# Patient Record
Sex: Male | Born: 1970
Health system: Southern US, Community
[De-identification: ages and names within clinical notes are randomized; demographics above are authoritative.]

## PROBLEM LIST (undated history)

## (undated) DIAGNOSIS — C801 Malignant (primary) neoplasm, unspecified: Secondary | ICD-10-CM

## (undated) DIAGNOSIS — J45909 Unspecified asthma, uncomplicated: Secondary | ICD-10-CM

## (undated) DIAGNOSIS — G4730 Sleep apnea, unspecified: Secondary | ICD-10-CM

## (undated) HISTORY — PX: BICEPS TENDON REPAIR: SHX566

## (undated) HISTORY — DX: Unspecified asthma, uncomplicated: J45.909

## (undated) HISTORY — PX: FRACTURE SURGERY: SHX138

---

## 2005-09-25 ENCOUNTER — Emergency Department (HOSPITAL_COMMUNITY): Admission: EM | Admit: 2005-09-25 | Discharge: 2005-09-25 | Payer: Self-pay | Admitting: Emergency Medicine

## 2005-10-02 ENCOUNTER — Ambulatory Visit (HOSPITAL_BASED_OUTPATIENT_CLINIC_OR_DEPARTMENT_OTHER): Admission: RE | Admit: 2005-10-02 | Discharge: 2005-10-02 | Payer: Self-pay | Admitting: *Deleted

## 2008-07-06 ENCOUNTER — Emergency Department (HOSPITAL_COMMUNITY): Admission: EM | Admit: 2008-07-06 | Discharge: 2008-07-06 | Payer: Self-pay | Admitting: Emergency Medicine

## 2008-07-16 ENCOUNTER — Encounter: Payer: Self-pay | Admitting: Cardiovascular Disease

## 2008-07-16 ENCOUNTER — Ambulatory Visit: Payer: Self-pay | Admitting: Cardiovascular Disease

## 2008-07-16 DIAGNOSIS — R0789 Other chest pain: Secondary | ICD-10-CM

## 2008-07-30 ENCOUNTER — Ambulatory Visit: Payer: Self-pay

## 2008-07-30 ENCOUNTER — Encounter: Payer: Self-pay | Admitting: Cardiovascular Disease

## 2008-08-04 ENCOUNTER — Telehealth: Payer: Self-pay | Admitting: Cardiovascular Disease

## 2009-12-23 ENCOUNTER — Emergency Department (HOSPITAL_COMMUNITY): Admission: EM | Admit: 2009-12-23 | Discharge: 2009-12-23 | Payer: Self-pay | Admitting: Emergency Medicine

## 2009-12-24 ENCOUNTER — Ambulatory Visit (HOSPITAL_COMMUNITY): Admission: RE | Admit: 2009-12-24 | Discharge: 2009-12-24 | Payer: Self-pay | Admitting: Family Medicine

## 2010-07-21 LAB — POCT CARDIAC MARKERS
CKMB, poc: 1.4 ng/mL (ref 1.0–8.0)
Myoglobin, poc: 89.9 ng/mL (ref 12–200)
Troponin i, poc: <0.05 ng/mL (ref 0.00–0.09)

## 2010-07-21 LAB — D-DIMER, QUANTITATIVE: D-Dimer, Quant: 0.27 ug/mL-FEU (ref 0.00–0.48)

## 2010-07-21 LAB — POCT I-STAT, CHEM 8
BUN: 16 mg/dL (ref 6–23)
Calcium, Ion: 1.1 mmol/L — ABNORMAL LOW (ref 1.12–1.32)
Chloride: 106 mEq/L (ref 96–112)
Creatinine, Ser: 1.3 mg/dL (ref 0.4–1.5)
Glucose, Bld: 109 mg/dL — ABNORMAL HIGH (ref 70–99)
HCT: 42 % (ref 39.0–52.0)
Hemoglobin: 14.3 g/dL (ref 13.0–17.0)
Potassium: 4.1 mEq/L (ref 3.5–5.1)
Sodium: 137 mEq/L (ref 135–145)
TCO2: 25 mmol/L (ref 0–100)

## 2010-08-26 NOTE — Op Note (Signed)
NAME:  Paul Shepherd, BUSS NO.:  [id number]   MEDICAL RECORD NO.:  [id number]          PATIENT TYPE:  AMB   LOCATION:  DSC                          FACILITY:  [facility]   PHYSICIAN:  Tennis Must Meyerdierks, M.D.DATE OF BIRTH:  12-01-1970   DATE OF PROCEDURE:  10/02/2005  DATE OF DISCHARGE:                                 OPERATIVE REPORT   PREOPERATIVE DIAGNOSIS:  Lacerated ulnar digital nerve, right small finger.   POSTOPERATIVE DIAGNOSIS:  Partial laceration ulnar digital nerve, right  small finger.   PROCEDURE:  Repair of ulnar digital nerve, right small finger.   SURGEON:  Lowell Bouton, M.D.   ANESTHESIA:  General.   OPERATIVE FINDINGS:  The patient had an 80% transection ulnarly on the ulnar  digital nerve.   PROCEDURE NOTE:  Under general anesthesia with a tourniquet on the right  arm, the right hand was prepped and draped in the usual fashion and after  exsanguinating the limb, the tourniquet was inflated to 250 mmHg.  The  previous sutures were removed and the laceration was extended proximally.  Blunt dissection was carried through the subcutaneous tissues.  Blunt  dissection was carried down to the ulnar digital nerve which was identified  and was found to be 20% intact.  The radial 20% was still approximated, the  ulnar 80% was transected.  The microscope was brought into the field and  micro instruments were used. A 9-0 nylon suture was used to repair the nerve  using an epineurial repair.  Three sutures were inserted to repair the 80%  of the transected nerve.  The wound was then irrigated copiously with  saline, the skin was closed with 4-0 nylon sutures.  Sterile dressings were  applied, 0.5% Marcaine digital block was inserted for pain control.  The  tourniquet was released with good circulation to the hand.  The patient went  to the recovery room awake in stable and good condition.      Lowell Bouton, M.D.  Electronically Signed     EMM/MEDQ  D:  10/02/2005  T:  10/02/2005  Job:  [id number]

## 2014-06-02 ENCOUNTER — Ambulatory Visit: Payer: Self-pay | Admitting: Family Medicine

## 2014-10-15 ENCOUNTER — Encounter: Payer: Self-pay | Admitting: *Deleted

## 2014-10-15 ENCOUNTER — Other Ambulatory Visit: Payer: Self-pay

## 2014-10-15 NOTE — Patient Instructions (Signed)
  Your procedure is scheduled on: 10-22-14 Report to [facility] To find out your arrival time please call [phone] between 1PM - 3PM on 10-21-14  Remember: Instructions that are not followed completely may result in serious medical risk, up to and including death, or upon the discretion of your surgeon and anesthesiologist your surgery may need to be rescheduled.    _X___ 1. Do not eat food or drink liquids after midnight. No gum chewing or hard candies.     _X___ 2. No Alcohol for 24 hours before or after surgery.   ____ 3. Bring all medications with you on the day of surgery if instructed.    ____ 4. Notify your doctor if there is any change in your medical condition     (cold, fever, infections).     Do not wear jewelry, make-up, hairpins, clips or nail polish.  Do not wear lotions, powders, or perfumes. You may wear deodorant.  Do not shave 48 hours prior to surgery. Men may shave face and neck.  Do not bring valuables to the hospital.    [facility] is not responsible for any belongings or valuables.               Contacts, dentures or bridgework may not be worn into surgery.  Leave your suitcase in the car. After surgery it may be brought to your room.  For patients admitted to the hospital, discharge time is determined by your treatment team.   Patients discharged the day of surgery will not be allowed to drive home.   Please read over the following fact sheets that you were given:     ____ Take these medicines the morning of surgery with A SIP OF WATER:    1. NONE  2.   3.   4.  5.  6.  ____ Fleet Enema (as directed)   ____ Use CHG Soap as directed  ____ Use inhalers on the day of surgery  ____ Stop metformin 2 days prior to surgery    ____ Take 1/2 of usual insulin dose the night before surgery and none on the morning of surgery.   ____ Stop Coumadin/Plavix/aspirin-N/A  ____ Stop Anti-inflammatories-NO NSAIDS OR ASA  PRODUCTS-TYLENOL OK   _X___ Stop supplements until after surgery-PT STOPPED KRILL OIL ON 10-14-14  ____ Bring C-Pap to the hospital.

## 2014-10-22 ENCOUNTER — Encounter
Admission: RE | Disposition: A | Discharge status: Home / Self Care | Payer: Self-pay | Source: Ambulatory Visit | Attending: Otolaryngology

## 2014-10-22 ENCOUNTER — Ambulatory Visit: Payer: Commercial Managed Care - HMO | Admitting: Anesthesiology

## 2014-10-22 ENCOUNTER — Encounter: Payer: Self-pay | Admitting: Anesthesiology

## 2014-10-22 ENCOUNTER — Observation Stay
Admission: RE | Admit: 2014-10-22 | Discharge: 2014-10-22 | Disposition: A | Discharge status: Home / Self Care | Payer: Commercial Managed Care - HMO | Source: Ambulatory Visit | Attending: Otolaryngology | Admitting: Otolaryngology

## 2014-10-22 DIAGNOSIS — R0683 Snoring: Secondary | ICD-10-CM | POA: Diagnosis not present

## 2014-10-22 DIAGNOSIS — Z9889 Other specified postprocedural states: Secondary | ICD-10-CM

## 2014-10-22 DIAGNOSIS — J3501 Chronic tonsillitis: Secondary | ICD-10-CM | POA: Diagnosis present

## 2014-10-22 DIAGNOSIS — G4733 Obstructive sleep apnea (adult) (pediatric): Secondary | ICD-10-CM | POA: Insufficient documentation

## 2014-10-22 DIAGNOSIS — Z6838 Body mass index (BMI) 38.0-38.9, adult: Secondary | ICD-10-CM | POA: Diagnosis not present

## 2014-10-22 DIAGNOSIS — E669 Obesity, unspecified: Secondary | ICD-10-CM | POA: Insufficient documentation

## 2014-10-22 DIAGNOSIS — Z85828 Personal history of other malignant neoplasm of skin: Secondary | ICD-10-CM | POA: Diagnosis not present

## 2014-10-22 HISTORY — PX: UVULOPALATOPHARYNGOPLASTY: SHX827

## 2014-10-22 HISTORY — PX: TONSILLECTOMY: SHX5217

## 2014-10-22 HISTORY — DX: Sleep apnea, unspecified: G47.30

## 2014-10-22 HISTORY — DX: Malignant (primary) neoplasm, unspecified: C80.1

## 2014-10-22 SURGERY — UPPP (UVULOPALATOPHARYNGOPLASTY)
Anesthesia: General | Wound class: Clean Contaminated

## 2014-10-22 MED ORDER — SODIUM CHLORIDE 0.9 % IR SOLN
Status: DC | PRN
  Administered 2014-10-22: 100 mL

## 2014-10-22 MED ORDER — GLYCOPYRROLATE 0.2 MG/ML IJ SOLN
INTRAMUSCULAR | Status: DC | PRN
  Administered 2014-10-22: .5 mg via INTRAVENOUS

## 2014-10-22 MED ORDER — LIDOCAINE VISCOUS 2 % MT SOLN: 10.0000 mL | Freq: Four times a day (QID) | OROMUCOSAL | Status: DC | PRN

## 2014-10-22 MED ORDER — FENTANYL CITRATE (PF) 100 MCG/2ML IJ SOLN
INTRAMUSCULAR | Status: DC | PRN
  Administered 2014-10-22: 250 ug via INTRAVENOUS

## 2014-10-22 MED ORDER — LACTATED RINGERS IV SOLN
INTRAVENOUS | Status: DC | PRN
  Administered 2014-10-22: 10:00:00 via INTRAVENOUS

## 2014-10-22 MED ORDER — DEXTROSE-NACL 5-0.45 % IV SOLN
INTRAVENOUS | Status: DC
  Administered 2014-10-22: 13:00:00 via INTRAVENOUS

## 2014-10-22 MED ORDER — LIDOCAINE HCL (CARDIAC) 20 MG/ML IV SOLN
INTRAVENOUS | Status: DC | PRN
  Administered 2014-10-22: 100 mg via INTRAVENOUS

## 2014-10-22 MED ORDER — BUPIVACAINE HCL (PF) 0.5 % IJ SOLN
INTRAMUSCULAR | Status: AC
  Filled 2014-10-22: qty 30

## 2014-10-22 MED ORDER — ROCURONIUM BROMIDE 100 MG/10ML IV SOLN
INTRAVENOUS | Status: DC | PRN
  Administered 2014-10-22: 35 mg via INTRAVENOUS

## 2014-10-22 MED ORDER — DEXAMETHASONE SODIUM PHOSPHATE 4 MG/ML IJ SOLN
INTRAMUSCULAR | Status: DC | PRN
  Administered 2014-10-22: 10 mg via INTRAVENOUS

## 2014-10-22 MED ORDER — OXYCODONE HCL 5 MG/5ML PO SOLN
10.0000 mg | ORAL | Status: DC | PRN
  Administered 2014-10-22: 10 mg via ORAL
  Filled 2014-10-22: qty 10

## 2014-10-22 MED ORDER — PREDNISONE 10 MG (21) PO TBPK
10.0000 mg | ORAL_TABLET | ORAL | Status: AC
  Administered 2014-10-22: 10 mg via ORAL

## 2014-10-22 MED ORDER — ONDANSETRON HCL 4 MG/2ML IJ SOLN: 4.0000 mg | INTRAMUSCULAR | Status: DC | PRN

## 2014-10-22 MED ORDER — OXYCODONE HCL 5 MG PO TABS: 5.0000 mg | ORAL_TABLET | Freq: Once | ORAL | Status: DC | PRN

## 2014-10-22 MED ORDER — MIDAZOLAM HCL 2 MG/2ML IJ SOLN
INTRAMUSCULAR | Status: DC | PRN
  Administered 2014-10-22: 2 mg via INTRAVENOUS

## 2014-10-22 MED ORDER — PREDNISONE 10 MG (21) PO TBPK: 20.0000 mg | ORAL_TABLET | Freq: Every evening | ORAL | Status: DC

## 2014-10-22 MED ORDER — OXYCODONE HCL 5 MG/5ML PO SOLN: 5.0000 mg | Freq: Once | ORAL | Status: DC | PRN

## 2014-10-22 MED ORDER — MORPHINE SULFATE 2 MG/ML IJ SOLN: 2.0000 mg | INTRAMUSCULAR | Status: DC | PRN

## 2014-10-22 MED ORDER — BUPIVACAINE HCL 0.5 % IJ SOLN
INTRAMUSCULAR | Status: DC | PRN
  Administered 2014-10-22: 1.5 mL

## 2014-10-22 MED ORDER — PREDNISONE 10 MG (21) PO TBPK: 10.0000 mg | ORAL_TABLET | Freq: Four times a day (QID) | ORAL | Status: DC

## 2014-10-22 MED ORDER — HYDROMORPHONE HCL 1 MG/ML IJ SOLN
INTRAMUSCULAR | Status: DC | PRN
  Administered 2014-10-22 (×2): 1 mg via INTRAVENOUS

## 2014-10-22 MED ORDER — PROPOFOL 10 MG/ML IV BOLUS
INTRAVENOUS | Status: DC | PRN
  Administered 2014-10-22: 200 mg via INTRAVENOUS

## 2014-10-22 MED ORDER — PREDNISONE 10 MG (21) PO TBPK
20.0000 mg | ORAL_TABLET | Freq: Every morning | ORAL | Status: DC
  Filled 2014-10-22: qty 21

## 2014-10-22 MED ORDER — OXYCODONE HCL 5 MG/5ML PO SOLN: 10.0000 mg | ORAL | Status: DC | PRN

## 2014-10-22 MED ORDER — PREDNISONE 10 MG (21) PO TBPK
20.0000 mg | ORAL_TABLET | Freq: Every evening | ORAL | Status: DC
Start: 2014-10-23 — End: 2014-10-22

## 2014-10-22 MED ORDER — PREDNISONE 10 MG (21) PO TBPK: ORAL_TABLET | ORAL | Status: DC

## 2014-10-22 MED ORDER — FAMOTIDINE 20 MG PO TABS
20.0000 mg | ORAL_TABLET | Freq: Once | ORAL | Status: AC
  Administered 2014-10-22: 20 mg via ORAL

## 2014-10-22 MED ORDER — PROMETHAZINE HCL 12.5 MG PO TABS: 12.5000 mg | ORAL_TABLET | Freq: Four times a day (QID) | ORAL | Status: DC | PRN

## 2014-10-22 MED ORDER — ACETAMINOPHEN 160 MG/5ML PO SOLN: 650.0000 mg | ORAL | Status: DC | PRN

## 2014-10-22 MED ORDER — SUCCINYLCHOLINE CHLORIDE 20 MG/ML IJ SOLN
INTRAMUSCULAR | Status: DC | PRN
  Administered 2014-10-22: 100 mg via INTRAVENOUS

## 2014-10-22 MED ORDER — ACETAMINOPHEN 10 MG/ML IV SOLN
INTRAVENOUS | Status: DC | PRN
  Administered 2014-10-22: 1000 mg via INTRAVENOUS

## 2014-10-22 MED ORDER — ONDANSETRON HCL 4 MG/2ML IJ SOLN
INTRAMUSCULAR | Status: DC | PRN
  Administered 2014-10-22: 4 mg via INTRAVENOUS

## 2014-10-22 MED ORDER — FENTANYL CITRATE (PF) 100 MCG/2ML IJ SOLN: 25.0000 ug | INTRAMUSCULAR | Status: DC | PRN

## 2014-10-22 MED ORDER — LACTATED RINGERS IV SOLN
Freq: Once | INTRAVENOUS | Status: AC
  Administered 2014-10-22: 10:00:00 via INTRAVENOUS

## 2014-10-22 MED ORDER — PREDNISONE 10 MG (21) PO TBPK
10.0000 mg | ORAL_TABLET | Freq: Three times a day (TID) | ORAL | Status: DC
Start: 2014-10-23 — End: 2014-10-22

## 2014-10-22 MED ORDER — PHENOL 1.4 % MT LIQD: 1.0000 | OROMUCOSAL | Status: DC | PRN

## 2014-10-22 MED ORDER — ONDANSETRON HCL 4 MG PO TABS
4.0000 mg | ORAL_TABLET | ORAL | Status: DC | PRN
Start: 2014-10-22 — End: 2014-10-22

## 2014-10-22 MED ORDER — ACETAMINOPHEN 10 MG/ML IV SOLN
INTRAVENOUS | Status: AC
  Filled 2014-10-22: qty 100

## 2014-10-22 MED ORDER — PREDNISONE 10 MG (21) PO TBPK: 10.0000 mg | ORAL_TABLET | ORAL | Status: DC

## 2014-10-22 MED ORDER — NEOSTIGMINE METHYLSULFATE 10 MG/10ML IV SOLN
INTRAVENOUS | Status: DC | PRN
  Administered 2014-10-22: 3 mg via INTRAVENOUS

## 2014-10-22 MED ORDER — FAMOTIDINE 20 MG PO TABS
ORAL_TABLET | ORAL | Status: AC
  Administered 2014-10-22: 20 mg via ORAL
  Filled 2014-10-22: qty 1

## 2014-10-22 MED ORDER — PHENOL 1.4 % MT LIQD
1.0000 | OROMUCOSAL | Status: DC | PRN
  Filled 2014-10-22: qty 177

## 2014-10-22 MED ORDER — ACETAMINOPHEN 650 MG RE SUPP: 650.0000 mg | RECTAL | Status: DC | PRN

## 2014-10-22 SURGICAL SUPPLY — 21 items
BLADE BOVIE TIP EXT 4 (BLADE) ×4 IMPLANT
BLADE SURG 15 STRL LF DISP TIS (BLADE) ×2 IMPLANT
BLADE SURG 15 STRL SS (BLADE) ×4
CANISTER SUCT 1200ML W/VALVE (MISCELLANEOUS) ×4 IMPLANT
CATH ROBINSON RED A/P 10FR (CATHETERS) ×4 IMPLANT
CATH ROBINSON RED A/P 14FR (CATHETERS) ×4 IMPLANT
COAG SUCT 10F 3.5MM HAND CTRL (MISCELLANEOUS) ×4 IMPLANT
ELECT CAUTERY BLADE TIP 2.5 (TIP) ×4
ELECTRODE CAUTERY BLDE TIP 2.5 (TIP) ×2 IMPLANT
GLOVE BIO SURGEON STRL SZ7.5 (GLOVE) ×6 IMPLANT
GOWN STRL REUS W/ TWL LRG LVL3 (GOWN DISPOSABLE) ×4 IMPLANT
GOWN STRL REUS W/TWL LRG LVL3 (GOWN DISPOSABLE) ×8
HANDLE SUCTION POOLE (INSTRUMENTS) ×2 IMPLANT
KIT RM TURNOVER STRD PROC AR (KITS) ×4 IMPLANT
NS IRRIG 500ML POUR BTL (IV SOLUTION) ×4 IMPLANT
PACK HEAD/NECK (MISCELLANEOUS) ×4 IMPLANT
PAD GROUND ADULT SPLIT (MISCELLANEOUS) ×4 IMPLANT
SUCTION POOLE HANDLE (INSTRUMENTS) ×4
SUT VIC AB 4-0 RB1 18 (SUTURE) ×4 IMPLANT
SYR 3ML LL SCALE MARK (SYRINGE) ×4 IMPLANT
SYR BULB IRRIG 60ML STRL (SYRINGE) ×4 IMPLANT

## 2014-10-22 NOTE — Anesthesia Preprocedure Evaluation (Signed)
Anesthesia Evaluation  Patient identified by MRN, date of birth, ID band Patient awake    Reviewed: Allergy & Precautions, H&P , NPO status , Patient's Chart, lab work & pertinent test results, reviewed documented beta blocker date and time   Airway Mallampati: II  TM Distance: >3 FB Neck ROM: full    Dental no notable dental hx. (+) Teeth Intact   Pulmonary sleep apnea , former smoker,  breath sounds clear to auscultation  Pulmonary exam normal       Cardiovascular - Past MI negative cardio ROS Normal cardiovascular examRhythm:regular Rate:Normal     Neuro/Psych negative neurological ROS  negative psych ROS   GI/Hepatic negative GI ROS, Neg liver ROS,   Endo/Other  negative endocrine ROS  Renal/GU negative Renal ROS  negative genitourinary   Musculoskeletal   Abdominal   Peds  Hematology negative hematology ROS (+)   Anesthesia Other Findings Past Medical History:   Sleep apnea                                                    Comment:no cpap-pt having UPPP on 10-22-14   Cancer                                                         Comment:BASAL CELL CARCINOMA Obesity: BMI 38  Reproductive/Obstetrics negative OB ROS                             Anesthesia Physical Anesthesia Plan  ASA: III  Anesthesia Plan: General ETT   Post-op Pain Management:    Induction:   Airway Management Planned:   Additional Equipment:   Intra-op Plan:   Post-operative Plan:   Informed Consent: I have reviewed the patients History and Physical, chart, labs and discussed the procedure including the risks, benefits and alternatives for the proposed anesthesia with the patient or authorized representative who has indicated his/her understanding and acceptance.   Dental Advisory Given  Plan Discussed with: Anesthesiologist, CRNA and Surgeon  Anesthesia Plan Comments:         Anesthesia Quick  Evaluation

## 2014-10-22 NOTE — Anesthesia Postprocedure Evaluation (Signed)
  Anesthesia Post-op Note  Patient: Paul Shepherd  Procedure(s) Performed: Procedure(s): UVULOPALATOPHARYNGOPLASTY (UPPP) (N/A) TONSILLECTOMY (Bilateral)  Anesthesia type:General ETT  Patient location: PACU  Post pain: Pain level controlled  Post assessment: Post-op Vital signs reviewed, Patient's Cardiovascular Status Stable, Respiratory Function Stable, Patent Airway and No signs of Nausea or vomiting  Post vital signs: Reviewed and stable  Last Vitals:  Filed Vitals:   10/22/14 1205  BP: 133/81  Pulse: 58  Temp: 37 C  Resp:     Level of consciousness: awake, alert  and patient cooperative  Complications: No apparent anesthesia complications

## 2014-10-22 NOTE — H&P (Signed)
..  History and Physical paper copy reviewed and updated date of procedure and will be scanned into system.  

## 2014-10-22 NOTE — Progress Notes (Signed)
Received pt from PACU RN to Rm 230.  AO4 Pt stable, in no acute distress, no c/o pain.  Pt able to swallow.  Pt oxygen saturation 93% on RA--pt on continuous pulse ox and remains above 90%.  Family at bedside.  Phone and Call bell within reach and pt educated use.  Pt resting comfortably and quietly with bed in low and locked position.   Will continue to monitor.

## 2014-10-22 NOTE — Progress Notes (Signed)
..   10/22/2014 5:05 PM  Paul Shepherd [id number]  Post-Op Day 0    Temp:  [98.3 F (36.8 C)-99.8 F (37.7 C)] 98.3 F (36.8 C) (07/14 1549) Pulse Rate:  [49-86] 56 (07/14 1549) Resp:  [10-18] 17 (07/14 1549) BP: (133-153)/(73-91) 138/73 mmHg (07/14 1549) SpO2:  [93 %-100 %] 96 % (07/14 1549) Weight:  [131.543 kg (290 lb)] 131.543 kg (290 lb) (07/14 0919),     Intake/Output Summary (Last 24 hours) at 10/22/14 1705 Last data filed at 10/22/14 1600  Gross per 24 hour  Intake   1720 ml  Output     15 ml  Net   1705 ml    No results found for this or any previous visit (from the past 24 hour(s)).  SUBJECTIVE:  No acute events.  Pain controlled with oral medications.  Able to tolerate fluids.  Ambulating to BR.  OBJECTIVE:  Gen- NAD, alert and oriented eating a popcicle Nose- clear anteriorly OC/OP- UP3 incision intact, mild expected edema and erythema, widely patent airway  IMPRESSION:  S/p UP3 doing well  PLAN:  OK to discharge given tolerance of pain and oral intake.  Follow up on July 25th or July 26th  Paul Shepherd 10/22/2014, 5:05 PM

## 2014-10-22 NOTE — Op Note (Signed)
..10/22/2014  10:59 AM    Paul Shepherd  [id number]   Pre-Op Dx:  OSA, TONSILLAR hypertrophy, chronic tonsillitis  Post-op Dx: OSA, TONSILLAR hypertrophy, chronic tonsillitis  Proc:   1)  Uvulopalatopharyngoplasty  2)  Tonsillectomy > age 42  Surg: Paul Shepherd  Anes:  General Endotracheal  EBL:  15ccs  Comp:  None  Findings:  3+ cryptic tonsils with tonsil stones in place, significant elongation of the uvula with redundant soft tissue of the soft palate.  Procedure: After the patient was identified in holding and the history and physical and consent was reviewed, the patient was taken to the operating room and placed in a supine position.  General endotracheal anesthesia was induced in the normal fashion.  At this time, the patient was rotated 45 degrees and a shoulder roll was placed.  At this time, a McIvor mouthgag was inserted into the patient's oral cavity and suspended from the [place] stand without injury to teeth, lips, or gums.  Next a red rubber catheter was inserted into the patient left nostril for retraction of the uvula and soft palate superiorly.  Next a curved Alice clamp was attached to the patient's right superior tonsillar pole and retracted medially and inferiorly.  A Bovie electrocautery was used to dissect the patient's right tonsil in a subcapsular plane.  Meticulous hemostasis was achieved with Bovie suction cautery.  At this time, the mouth gag was released from suspension for 1 minute to allow for tongue reperfusion.  Care was taken to preserve the posterior tonsillar pillar.  Attention now was directed to the patient's left side.  In a similar fashion the curved Alice clamp was attached to the superior pole and this was retracted medially and inferiorly and the tonsil was excised in a subcapsular plane with Bovie electrocautery.  After completion of the second tonsil, meticulous hemostasis was continued.  Again, care was taken to preserve the mucosa of  the posterior tonsillar pillar.  The patient was released from suspension next and the red rubber catheter was removed from the patient's nasal cavity.  After allowing time for tongue reperfusion again, the patient was placed back into suspension with the mouth-gag.  At this time, attention was directed to the patient's palate.  Under direct visualization, forceps were used to size the amount of palatal tissue needed for posterior pharyngeal closure.  Once this was marked with a bovie cautery, a beveled incision was made in the mucosa of the oral cavity and carried through the base of the uvula to the posterior palatal mucosa.  Care was taken to preserve the posterior pharyngeal mucosa.  This was done in a "square" fashion in the anterior mucosal surface.  At this time, 4.0 Vicryl sutures were placed in a horizontal mattress pattern placed from the anterior mucosal surface widening the patient's palatal opening.  This was meticulously repeated along the anterior and posterior tonsillar pillars as well as the soft palatal muscles and mucosa.  This created a "fish mouth" opening widening the oropharyngeal airway.  The uvula and soft palate tissue was passed off the table for permanent examination. Meticulous hemostasis was continued.  At this time, the patient's nasal cavity and oral cavity was irrigated with sterile saline.  1.5 ccs of 0.5% Marcaine was injected into the anterior and posterior tonsillar fossa bilaterally.  Following this  The care of patient was returned to anesthesia, awakened, and transferred to recovery in stable condition.  Dispo: Admission for observation.  Plan: Soft diet.  Limit exercise and  strenuous activity for 2 weeks.  Fluid hydration  Recheck my office three weeks.   Paul Shepherd 10:59 AM 10/22/2014

## 2014-10-22 NOTE — Transfer of Care (Signed)
Immediate Anesthesia Transfer of Care Note  Patient: Paul Shepherd  Procedure(s) Performed: Procedure(s): UVULOPALATOPHARYNGOPLASTY (UPPP) (N/A) TONSILLECTOMY (Bilateral)  Patient Location: PACU  Anesthesia Type:General  Level of Consciousness: awake, sedated and patient cooperative  Airway & Oxygen Therapy: Patient Spontanous Breathing and aerosol face mask  Post-op Assessment: Report given to RN and Post -op Vital signs reviewed and stable  Post vital signs: Reviewed and stable  Last Vitals:  Filed Vitals:   10/22/14 1059  BP: 153/84  Pulse:   Temp: 37.7 C  Resp: 10    Complications: No apparent anesthesia complications

## 2014-10-23 LAB — SURGICAL PATHOLOGY

## 2016-05-05 DIAGNOSIS — Z85828 Personal history of other malignant neoplasm of skin: Secondary | ICD-10-CM | POA: Diagnosis not present

## 2016-05-12 ENCOUNTER — Ambulatory Visit (INDEPENDENT_AMBULATORY_CARE_PROVIDER_SITE_OTHER): Payer: Commercial Managed Care - HMO | Admitting: Family Medicine

## 2016-05-12 ENCOUNTER — Encounter: Payer: Self-pay | Admitting: Family Medicine

## 2016-05-12 VITALS — BP 148/98 | HR 64 | Temp 98.2°F | Resp 16 | Ht 72.75 in | Wt 292.4 lb

## 2016-05-12 DIAGNOSIS — Z0000 Encounter for general adult medical examination without abnormal findings: Secondary | ICD-10-CM

## 2016-05-12 DIAGNOSIS — R030 Elevated blood-pressure reading, without diagnosis of hypertension: Secondary | ICD-10-CM

## 2016-05-12 DIAGNOSIS — Z9889 Other specified postprocedural states: Secondary | ICD-10-CM | POA: Diagnosis not present

## 2016-05-12 DIAGNOSIS — Z23 Encounter for immunization: Secondary | ICD-10-CM | POA: Diagnosis not present

## 2016-05-12 DIAGNOSIS — Z1211 Encounter for screening for malignant neoplasm of colon: Secondary | ICD-10-CM

## 2016-05-12 DIAGNOSIS — Z114 Encounter for screening for human immunodeficiency virus [HIV]: Secondary | ICD-10-CM | POA: Diagnosis not present

## 2016-05-12 LAB — IFOBT (OCCULT BLOOD): IMMUNOLOGICAL FECAL OCCULT BLOOD TEST: NEGATIVE

## 2016-05-12 NOTE — Progress Notes (Signed)
Patient: Paul Shepherd, Male    DOB: 05/06/1970, 46 y.o.   MRN: [id number] Visit Date: 05/12/2016  Today's Provider: Vernie Murders, PA   Chief Complaint  Patient presents with  . Annual Exam   Subjective:    Annual physical exam Paul Shepherd is a 46 y.o. male who presents today for health maintenance and complete physical. He feels well. He reports exercising none, only working. He reports he is sleeping well.  ----------------------------------------------------------------- Past Medical History:  Diagnosis Date  . Cancer (HCC)    BASAL CELL CARCINOMA  . Sleep apnea    no cpap-pt having UPPP on 10-22-14   Past Surgical History:  Procedure Laterality Date  . BICEPS TENDON REPAIR    . FRACTURE SURGERY     WRIST  . TONSILLECTOMY Bilateral 10/22/2014   Procedure: TONSILLECTOMY;  Surgeon: Carloyn Manner, MD;  Location: [facility] ORS;  Service: ENT;  Laterality: Bilateral;  . UVULOPALATOPHARYNGOPLASTY N/A 10/22/2014   Procedure: UVULOPALATOPHARYNGOPLASTY (UPPP);  Surgeon: Carloyn Manner, MD;  Location: [facility] ORS;  Service: ENT;  Laterality: N/A;   Family History  Problem Relation Age of Onset  . Cirrhosis Father    No Known Allergies  Review of Systems  Constitutional: Negative.   HENT: Negative.   Eyes: Negative.   Respiratory: Negative.   Cardiovascular: Negative.   Gastrointestinal: Negative.   Endocrine: Negative.   Genitourinary: Negative.   Musculoskeletal: Negative.   Skin: Negative.   Allergic/Immunologic: Negative.   Neurological: Negative.   Hematological: Negative.   Psychiatric/Behavioral: Negative.     Social History      He  reports that he quit smoking about 11 years ago. His smoking use included Cigarettes. He smoked 1.00 pack per day for 0.00 years. He has never used smokeless tobacco. He reports that he does not drink alcohol or use drugs.       Social History   Social History  . Marital status: Married    Spouse name:  N/A  . Number of children: N/A  . Years of education: N/A   Social History Main Topics  . Smoking status: Former Smoker    Packs/day: 1.00    Years: 0.00    Types: Cigarettes    Quit date: 10/14/2004  . Smokeless tobacco: Never Used  . Alcohol use No  . Drug use: No  . Sexual activity: Not Asked   Other Topics Concern  . None   Social History Narrative  . None    Past Medical History:  Diagnosis Date  . Cancer (HCC)    BASAL CELL CARCINOMA  . Sleep apnea    no cpap-pt having UPPP on 10-22-14    Patient Active Problem List   Diagnosis Date Noted  . S/P uvulopalatopharyngoplasty 10/22/2014  . CHEST PAIN, ATYPICAL 07/16/2008    Past Surgical History:  Procedure Laterality Date  . BICEPS TENDON REPAIR    . FRACTURE SURGERY     WRIST  . TONSILLECTOMY Bilateral 10/22/2014   Procedure: TONSILLECTOMY;  Surgeon: Carloyn Manner, MD;  Location: [facility] ORS;  Service: ENT;  Laterality: Bilateral;  . UVULOPALATOPHARYNGOPLASTY N/A 10/22/2014   Procedure: UVULOPALATOPHARYNGOPLASTY (UPPP);  Surgeon: Carloyn Manner, MD;  Location: [facility] ORS;  Service: ENT;  Laterality: N/A;    Current Outpatient Prescriptions:  .  KRILL OIL PO, Take 1 capsule by mouth., Disp: , Rfl:    Patient Care Team: Margo Common, PA as PCP - General (Physician Assistant)      Objective:  Vitals: BP (!) 152/102 (BP Location: Right Arm, Patient Position: Sitting, Cuff Size: Large)   Pulse 64   Temp 98.2 F (36.8 C) (Oral)   Resp 16   Ht 6' 0.75" (1.848 m)   Wt 292 lb 6.4 oz (132.6 kg)   SpO2 95%   BMI 38.84 kg/m    Physical Exam  Constitutional: He is oriented to person, place, and time. He appears well-developed and well-nourished.  HENT:  Head: Normocephalic and atraumatic.  Right Ear: External ear normal.  Left Ear: External ear normal.  Nose: Nose normal.  Mouth/Throat: Oropharynx is clear and moist.  Eyes: Conjunctivae and EOM are normal. Pupils are equal, round, and reactive to  light. Right eye exhibits no discharge.  Neck: Normal range of motion. Neck supple. No tracheal deviation present. No thyromegaly present.  Cardiovascular: Normal rate, regular rhythm, normal heart sounds and intact distal pulses.   No murmur heard. Pulmonary/Chest: Effort normal and breath sounds normal. No respiratory distress. He has no wheezes. He has no rales. He exhibits no tenderness.  Abdominal: Soft. Bowel sounds are normal. He exhibits no distension and no mass. There is no tenderness. There is no rebound and no guarding.  Genitourinary: Prostate normal and penis normal. Rectal exam shows guaiac negative stool.  Genitourinary Comments: Small external hemorrhoids without thrombosis.  Musculoskeletal: Normal range of motion. He exhibits no edema or tenderness.  Well healed scar right inner elbow from past surgery on bicep tendon.  Lymphadenopathy:    He has no cervical adenopathy.  Neurological: He is alert and oriented to person, place, and time. He has normal reflexes. No cranial nerve deficit. He exhibits normal muscle tone. Coordination normal.  Skin: Skin is warm and dry. No rash noted. No erythema.  Psychiatric: He has a normal mood and affect. His behavior is normal. Judgment and thought content normal.   Depression Screen PHQ 2/9 Scores 05/12/2016  PHQ - 2 Score 0    Assessment & Plan:     Routine Health Maintenance and Physical Exam  Exercise Activities and Dietary recommendations Goals    Work is very physical daily.      Immunization History  Administered Date(s) Administered  . Td 10/27/2004    Health Maintenance  Topic Date Due  . HIV Screening  01/07/1988  . TETANUS/TDAP  10/28/2014  . INFLUENZA VACCINE  11/09/2015     Discussed health benefits of physical activity, and encouraged him to engage in regular exercise appropriate for his age and condition.    -------------------------------------------------------------------- 1. Annual physical  exam General health is very good. Could lose some weight but has a large bone structure. Needs up date of Td booster. Given anticipatory guidance and will check routine labs. Follow up pending reports. - CBC with Differential/Platelet - Comprehensive metabolic panel - Lipid panel  2. S/P uvulopalatopharyngoplasty Surgery by Dr. Pryor Ochoa (ENT) on 10-22-14 to help sleep apnea and snoring. Did not tolerate the CPAP. Well healed with uvula absent now. Has to be careful about drinking fluids. Feels this had helped a great deal with sleep and energy level.  3. Elevated blood pressure reading Recheck of BP after exam was 148/98. Recommend he restrict salt, fats, caffeine and ETOH intake. Should work on a little weight loss and regular exercise program. Check routine labs and recheck BP in 2 months pending reports. - CBC with Differential/Platelet - Comprehensive metabolic panel - Lipid panel - TSH  4. Need for vaccine for DT (diphtheria-tetanus) Given Tetanus/diphtheria >7yo - Preservative free.  5. Colon cancer screening Negative OC-Light stool test for occult blood.  - IFOBT POC (occult bld, rslt in office)  6. Screening for HIV (human immunodeficiency virus) Asymptomatic. Will check blood test for antibodies. - HIV antibody    Vernie Murders, PA  [facility]

## 2016-05-12 NOTE — Patient Instructions (Signed)
DASH Eating Plan DASH stands for "Dietary Approaches to Stop Hypertension." The DASH eating plan is a healthy eating plan that has been shown to reduce high blood pressure (hypertension). Additional health benefits may include reducing the risk of type 2 diabetes mellitus, heart disease, and stroke. The DASH eating plan may also help with weight loss. What do I need to know about the DASH eating plan? For the DASH eating plan, you will follow these general guidelines:  Choose foods with less than 150 milligrams of sodium per serving (as listed on the food label).  Use salt-free seasonings or herbs instead of table salt or sea salt.  Check with your health care provider or pharmacist before using salt substitutes.  Eat lower-sodium products. These are often labeled as "low-sodium" or "no salt added."  Eat fresh foods. Avoid eating a lot of canned foods.  Eat more vegetables, fruits, and low-fat dairy products.  Choose whole grains. Look for the word "whole" as the first word in the ingredient list.  Choose fish and skinless chicken or turkey more often than red meat. Limit fish, poultry, and meat to 6 oz (170 g) each day.  Limit sweets, desserts, sugars, and sugary drinks.  Choose heart-healthy fats.  Eat more home-cooked food and less restaurant, buffet, and fast food.  Limit fried foods.  Do not fry foods. Cook foods using methods such as baking, boiling, grilling, and broiling instead.  When eating at a restaurant, ask that your food be prepared with less salt, or no salt if possible. What foods can I eat? Seek help from a dietitian for individual calorie needs. Grains  Whole grain or whole wheat bread. Brown rice. Whole grain or whole wheat pasta. Quinoa, bulgur, and whole grain cereals. Low-sodium cereals. Corn or whole wheat flour tortillas. Whole grain cornbread. Whole grain crackers. Low-sodium crackers. Vegetables  Fresh or frozen vegetables (raw, steamed, roasted, or  grilled). Low-sodium or reduced-sodium tomato and vegetable juices. Low-sodium or reduced-sodium tomato sauce and paste. Low-sodium or reduced-sodium canned vegetables. Fruits  All fresh, canned (in natural juice), or frozen fruits. Meat and Other Protein Products  Ground beef (85% or leaner), grass-fed beef, or beef trimmed of fat. Skinless chicken or turkey. Ground chicken or turkey. Pork trimmed of fat. All fish and seafood. Eggs. Dried beans, peas, or lentils. Unsalted nuts and seeds. Unsalted canned beans. Dairy  Low-fat dairy products, such as skim or 1% milk, 2% or reduced-fat cheeses, low-fat ricotta or cottage cheese, or plain low-fat yogurt. Low-sodium or reduced-sodium cheeses. Fats and Oils  Tub margarines without trans fats. Light or reduced-fat mayonnaise and salad dressings (reduced sodium). Avocado. Safflower, olive, or canola oils. Natural peanut or almond butter. Other  Unsalted popcorn and pretzels. The items listed above may not be a complete list of recommended foods or beverages. Contact your dietitian for more options.  What foods are not recommended? Grains  White bread. White pasta. White rice. Refined cornbread. Bagels and croissants. Crackers that contain trans fat. Vegetables  Creamed or fried vegetables. Vegetables in a cheese sauce. Regular canned vegetables. Regular canned tomato sauce and paste. Regular tomato and vegetable juices. Fruits  Canned fruit in light or heavy syrup. Fruit juice. Meat and Other Protein Products  Fatty cuts of meat. Ribs, chicken wings, bacon, sausage, bologna, salami, chitterlings, fatback, hot dogs, bratwurst, and packaged luncheon meats. Salted nuts and seeds. Canned beans with salt. Dairy  Whole or 2% milk, cream, half-and-half, and cream cheese. Whole-fat or sweetened yogurt. Full-fat cheeses   or blue cheese. Nondairy creamers and whipped toppings. Processed cheese, cheese spreads, or cheese curds. Condiments  Onion and garlic  salt, seasoned salt, table salt, and sea salt. Canned and packaged gravies. Worcestershire sauce. Tartar sauce. Barbecue sauce. Teriyaki sauce. Soy sauce, including reduced sodium. Steak sauce. Fish sauce. Oyster sauce. Cocktail sauce. Horseradish. Ketchup and mustard. Meat flavorings and tenderizers. Bouillon cubes. Hot sauce. Tabasco sauce. Marinades. Taco seasonings. Relishes. Fats and Oils  Butter, stick margarine, lard, shortening, ghee, and bacon fat. Coconut, palm kernel, or palm oils. Regular salad dressings. Other  Pickles and olives. Salted popcorn and pretzels. The items listed above may not be a complete list of foods and beverages to avoid. Contact your dietitian for more information.  Where can I find more information? National Heart, Lung, and [employer] Institute: www.nhlbi.nih.gov/health/health-topics/topics/dash/ This information is not intended to replace advice given to you by your health care provider. Make sure you discuss any questions you have with your health care provider. Document Released: 03/16/2011 Document Revised: 09/02/2015 Document Reviewed: 01/29/2013 Elsevier Interactive Patient Education  2017 Elsevier Inc.  

## 2016-05-13 LAB — COMPREHENSIVE METABOLIC PANEL
A/G RATIO: 1.8 (ref 1.2–2.2)
ALK PHOS: 77 IU/L (ref 39–117)
ALT: 28 IU/L (ref 0–44)
AST: 23 IU/L (ref 0–40)
Albumin: 4.5 g/dL (ref 3.5–5.5)
BUN/Creatinine Ratio: 10 (ref 9–20)
BUN: 11 mg/dL (ref 6–24)
Bilirubin Total: 0.4 mg/dL (ref 0.0–1.2)
CALCIUM: 9.7 mg/dL (ref 8.7–10.2)
CHLORIDE: 99 mmol/L (ref 96–106)
CO2: 24 mmol/L (ref 18–29)
Creatinine, Ser: 1.14 mg/dL (ref 0.76–1.27)
GFR calc Af Amer: 91 mL/min/{1.73_m2} (ref >59)
GFR, EST NON AFRICAN AMERICAN: 78 mL/min/{1.73_m2} (ref >59)
Globulin, Total: 2.5 g/dL (ref 1.5–4.5)
Glucose: 94 mg/dL (ref 65–99)
POTASSIUM: 5.1 mmol/L (ref 3.5–5.2)
Sodium: 140 mmol/L (ref 134–144)
Total Protein: 7 g/dL (ref 6.0–8.5)

## 2016-05-13 LAB — CBC WITH DIFFERENTIAL/PLATELET
BASOS: 1 %
Basophils Absolute: 0 10*3/uL (ref 0.0–0.2)
EOS (ABSOLUTE): 0.1 10*3/uL (ref 0.0–0.4)
Eos: 1 %
Hematocrit: 42.4 % (ref 37.5–51.0)
Hemoglobin: 14.1 g/dL (ref 13.0–17.7)
IMMATURE GRANULOCYTES: 0 %
Immature Grans (Abs): 0 10*3/uL (ref 0.0–0.1)
Lymphocytes Absolute: 2.1 10*3/uL (ref 0.7–3.1)
Lymphs: 36 %
MCH: 28.4 pg (ref 26.6–33.0)
MCHC: 33.3 g/dL (ref 31.5–35.7)
MCV: 85 fL (ref 79–97)
MONOS ABS: 0.4 10*3/uL (ref 0.1–0.9)
Monocytes: 7 %
NEUTROS PCT: 55 %
Neutrophils Absolute: 3.2 10*3/uL (ref 1.4–7.0)
PLATELETS: 240 10*3/uL (ref 150–379)
RBC: 4.97 x10E6/uL (ref 4.14–5.80)
RDW: 13.7 % (ref 12.3–15.4)
WBC: 5.9 10*3/uL (ref 3.4–10.8)

## 2016-05-13 LAB — TSH: TSH: 2.38 u[IU]/mL (ref 0.450–4.500)

## 2016-05-13 LAB — LIPID PANEL
CHOL/HDL RATIO: 5.2 ratio — AB (ref 0.0–5.0)
Cholesterol, Total: 186 mg/dL (ref 100–199)
HDL: 36 mg/dL — AB (ref >39)
LDL Calculated: 111 mg/dL — ABNORMAL HIGH (ref 0–99)
TRIGLYCERIDES: 195 mg/dL — AB (ref 0–149)
VLDL Cholesterol Cal: 39 mg/dL (ref 5–40)

## 2016-05-13 LAB — HIV ANTIBODY (ROUTINE TESTING W REFLEX): HIV Screen 4th Generation wRfx: NONREACTIVE

## 2016-05-15 ENCOUNTER — Telehealth: Payer: Self-pay

## 2016-05-15 NOTE — Telephone Encounter (Signed)
Pt advised.   Thanks,   -Shawntelle Ungar  

## 2016-05-15 NOTE — Telephone Encounter (Signed)
-----   Message from Margo Common, [place] sent at 05/13/2016 10:48 AM EST ----- Blood tests normal except elevation of LDL and triglycerides. May continue Krill Oil 1000 mg qd, follow low fat diet and exercise regularly. Recheck progress in 3 months to be sure cholesterol medication not needed.

## 2016-07-14 ENCOUNTER — Encounter: Payer: Self-pay | Admitting: Family Medicine

## 2016-07-14 ENCOUNTER — Ambulatory Visit (INDEPENDENT_AMBULATORY_CARE_PROVIDER_SITE_OTHER): Payer: Commercial Managed Care - HMO | Admitting: Family Medicine

## 2016-07-14 DIAGNOSIS — E782 Mixed hyperlipidemia: Secondary | ICD-10-CM | POA: Diagnosis not present

## 2016-07-14 NOTE — Progress Notes (Signed)
Patient: Paul Shepherd Male    DOB: 1970-07-16   46 y.o.   MRN: [id number] Visit Date: 07/14/2016  Today's Provider: Vernie Murders, PA   Chief Complaint  Patient presents with  . Hyperlipidemia  . Follow-up   Subjective:    HPI  Lipid/Cholesterol, Follow-up:   Last seen for this 2 months ago.  Management since that visit includes advised to continue Krill Oil 1000 mg and follow a low fat diet and exercise.  Last Lipid Panel:    Component Value Date/Time   CHOL 186 05/12/2016 1029   TRIG 195 (H) 05/12/2016 1029   HDL 36 (L) 05/12/2016 1029   CHOLHDL 5.2 (H) 05/12/2016 1029   LDLCALC 111 (H) 05/12/2016 1029    He reports good compliance with treatment. He is not having side effects.   Wt Readings from Last 3 Encounters:  07/14/16 286 lb 3.2 oz (129.8 kg)  05/12/16 292 lb 6.4 oz (132.6 kg)  10/22/14 290 lb (131.5 kg)    ------------------------------------------------------------------------ Past Medical History:  Diagnosis Date  . Cancer (HCC)    BASAL CELL CARCINOMA  . Sleep apnea    no cpap-pt having UPPP on 10-22-14   Patient Active Problem List   Diagnosis Date Noted  . S/P uvulopalatopharyngoplasty 10/22/2014  . CHEST PAIN, ATYPICAL 07/16/2008   Past Surgical History:  Procedure Laterality Date  . BICEPS TENDON REPAIR    . FRACTURE SURGERY     WRIST  . TONSILLECTOMY Bilateral 10/22/2014   Procedure: TONSILLECTOMY;  Surgeon: Carloyn Manner, MD;  Location: [facility];  Service: ENT;  Laterality: Bilateral;  . UVULOPALATOPHARYNGOPLASTY N/A 10/22/2014   Procedure: UVULOPALATOPHARYNGOPLASTY (UPPP);  Surgeon: Carloyn Manner, MD;  Location: [facility];  Service: ENT;  Laterality: N/A;   Family History  Problem Relation Age of Onset  . Cirrhosis Father    No Known Allergies   Previous Medications   KRILL OIL PO    Take 1 capsule by mouth.    Review of Systems  Constitutional: Negative.   Respiratory: Negative.   Cardiovascular: Negative.      Social History  Substance Use Topics  . Smoking status: Former Smoker    Packs/day: 1.00    Years: 0.00    Types: Cigarettes    Quit date: 10/14/2004  . Smokeless tobacco: Never Used  . Alcohol use No   Objective:   BP 128/86 (BP Location: Right Arm, Patient Position: Sitting, Cuff Size: Normal)   Pulse (!) 57   Temp 98.3 F (36.8 C) (Oral)   Wt 286 lb 3.2 oz (129.8 kg)   SpO2 98%   BMI 38.02 kg/m   Physical Exam  Constitutional: He is oriented to person, place, and time. He appears well-developed and well-nourished. No distress.  HENT:  Head: Normocephalic and atraumatic.  Right Ear: Hearing normal.  Left Ear: Hearing normal.  Nose: Nose normal.  Eyes: Conjunctivae and lids are normal. Right eye exhibits no discharge. Left eye exhibits no discharge. No scleral icterus.  Neck: Neck supple.  Cardiovascular: Normal rate and regular rhythm.   Pulmonary/Chest: Effort normal and breath sounds normal. No respiratory distress.  Abdominal: Soft. Bowel sounds are normal.  Musculoskeletal: Normal range of motion.  Neurological: He is alert and oriented to person, place, and time.  Skin: Skin is intact. No lesion and no rash noted.  Psychiatric: He has a normal mood and affect. His speech is normal and behavior is normal. Thought content normal.      Assessment & Plan:  1. Hyperlipidemia, mixed Feeling well and has lost 6 lbs since 05-12-16. Tolerating low fat diet, extra exercise and Krill Oil. Will recheck lipid and metabolic function test. Recheck pending reports. - Lipid panel - Comprehensive metabolic panel

## 2016-07-15 LAB — COMPREHENSIVE METABOLIC PANEL
ALT: 27 IU/L (ref 0–44)
AST: 23 IU/L (ref 0–40)
Albumin/Globulin Ratio: 1.6 (ref 1.2–2.2)
Albumin: 4.3 g/dL (ref 3.5–5.5)
Alkaline Phosphatase: 81 IU/L (ref 39–117)
BILIRUBIN TOTAL: 0.4 mg/dL (ref 0.0–1.2)
BUN/Creatinine Ratio: 13 (ref 9–20)
BUN: 13 mg/dL (ref 6–24)
CHLORIDE: 100 mmol/L (ref 96–106)
CO2: 25 mmol/L (ref 18–29)
CREATININE: 1 mg/dL (ref 0.76–1.27)
Calcium: 9.7 mg/dL (ref 8.7–10.2)
GFR calc Af Amer: 106 mL/min/{1.73_m2} (ref >59)
GFR calc non Af Amer: 92 mL/min/{1.73_m2} (ref >59)
GLUCOSE: 96 mg/dL (ref 65–99)
Globulin, Total: 2.7 g/dL (ref 1.5–4.5)
Potassium: 5 mmol/L (ref 3.5–5.2)
Sodium: 141 mmol/L (ref 134–144)
Total Protein: 7 g/dL (ref 6.0–8.5)

## 2016-07-15 LAB — LIPID PANEL
CHOL/HDL RATIO: 5.3 ratio — AB (ref 0.0–5.0)
Cholesterol, Total: 200 mg/dL — ABNORMAL HIGH (ref 100–199)
HDL: 38 mg/dL — ABNORMAL LOW (ref >39)
LDL Calculated: 127 mg/dL — ABNORMAL HIGH (ref 0–99)
Triglycerides: 177 mg/dL — ABNORMAL HIGH (ref 0–149)
VLDL CHOLESTEROL CAL: 35 mg/dL (ref 5–40)

## 2016-07-17 ENCOUNTER — Other Ambulatory Visit: Payer: Self-pay

## 2016-07-17 MED ORDER — PRAVASTATIN SODIUM 20 MG PO TABS: 20.0000 mg | ORAL_TABLET | Freq: Every day | ORAL | 3 refills | Status: DC

## 2016-07-17 NOTE — Progress Notes (Signed)
Patient advised  ED 

## 2016-08-28 DIAGNOSIS — M19021 Primary osteoarthritis, right elbow: Secondary | ICD-10-CM | POA: Diagnosis not present

## 2016-08-30 DIAGNOSIS — M19021 Primary osteoarthritis, right elbow: Secondary | ICD-10-CM | POA: Diagnosis not present

## 2016-10-16 ENCOUNTER — Ambulatory Visit (INDEPENDENT_AMBULATORY_CARE_PROVIDER_SITE_OTHER): Payer: 59 | Admitting: Family Medicine

## 2016-10-16 ENCOUNTER — Encounter: Payer: Self-pay | Admitting: Family Medicine

## 2016-10-16 VITALS — BP 124/86 | HR 64 | Temp 98.3°F | Wt 282.2 lb

## 2016-10-16 DIAGNOSIS — E782 Mixed hyperlipidemia: Secondary | ICD-10-CM | POA: Diagnosis not present

## 2016-10-16 NOTE — Progress Notes (Signed)
Patient: Paul Shepherd Male    DOB: [birth year]   47 y.o.   MRN: [id number] Visit Date: 10/16/2016  Today's Provider: Vernie Murders, PA   Chief Complaint  Patient presents with  . Hyperlipidemia  . Follow-up   Subjective:    HPI  Lipid/Cholesterol, Follow-up:   Last seen for this3 months ago.  Management changes since that visit include continue Krill oil and regular exercise. On 4/9 started Pravastatin 20 mg due to Triglycerides being worse. . Last Lipid Panel:    Component Value Date/Time   CHOL 200 (H) 07/14/2016 0846   TRIG 177 (H) 07/14/2016 0846   HDL 38 (L) 07/14/2016 0846   CHOLHDL 5.3 (H) 07/14/2016 0846   LDLCALC 127 (H) 07/14/2016 0846   He reports good compliance with treatment. He is not having side effects.  Current symptoms include none  Weight trend: stable Current diet: no red meat, eating salad, chicken and fish Current exercise: walking  Wt Readings from Last 3 Encounters:  10/16/16 282 lb 3.2 oz (128 kg)  07/14/16 286 lb 3.2 oz (129.8 kg)  05/12/16 292 lb 6.4 oz (132.6 kg)    ------------------------------------------------------------------- Patient Active Problem List   Diagnosis Date Noted  . Hyperlipidemia, mixed 07/14/2016  . S/P uvulopalatopharyngoplasty 10/22/2014  . CHEST PAIN, ATYPICAL 07/16/2008   Past Surgical History:  Procedure Laterality Date  . BICEPS TENDON REPAIR    . FRACTURE SURGERY     WRIST  . TONSILLECTOMY Bilateral 10/22/2014   Procedure: TONSILLECTOMY;  Surgeon: Carloyn Manner, MD;  Location: [facility];  Service: ENT;  Laterality: Bilateral;  . UVULOPALATOPHARYNGOPLASTY N/A 10/22/2014   Procedure: UVULOPALATOPHARYNGOPLASTY (UPPP);  Surgeon: Carloyn Manner, MD;  Location: [facility];  Service: ENT;  Laterality: N/A;   Family History  Problem Relation Age of Onset  . Cirrhosis Father    No Known Allergies   Previous Medications   KRILL OIL PO    Take 1 capsule by mouth.   PRAVASTATIN (PRAVACHOL) 20 MG  TABLET    Take 1 tablet (20 mg total) by mouth daily.    Review of Systems  Constitutional: Negative.   Respiratory: Negative.   Cardiovascular: Negative.   Musculoskeletal: Negative.     Social History  Substance Use Topics  . Smoking status: Former Smoker    Packs/day: 1.00    Years: 0.00    Types: Cigarettes    Quit date: 10/14/2004  . Smokeless tobacco: Never Used  . Alcohol use No   Objective:   BP 124/86 (BP Location: Right Arm, Patient Position: Sitting, Cuff Size: Normal)   Pulse 64   Temp 98.3 F (36.8 C) (Oral)   Wt 282 lb 3.2 oz (128 kg)   SpO2 99%   BMI 37.49 kg/m   Physical Exam  Constitutional: He is oriented to person, place, and time. He appears well-developed and well-nourished. No distress.  HENT:  Head: Normocephalic and atraumatic.  Right Ear: Hearing normal.  Left Ear: Hearing normal.  Nose: Nose normal.  Eyes: Conjunctivae and lids are normal. Right eye exhibits no discharge. Left eye exhibits no discharge. No scleral icterus.  Cardiovascular: Normal rate and regular rhythm.   Pulmonary/Chest: Effort normal. No respiratory distress.  Abdominal: Soft. Bowel sounds are normal.  Musculoskeletal: Normal range of motion.  Neurological: He is alert and oriented to person, place, and time.  Skin: Skin is intact. No lesion and no rash noted.  Psychiatric: He has a normal mood and affect. His speech is normal and behavior  is normal. Thought content normal.      Assessment & Plan:     1. Hyperlipidemia, mixed Feeling well and has lost 10 lbs since 05-12-16. Still taking Pravastatin and Krill Oil daily. Continues to exercise regularly and follow a low fat diet. Continue present regimen and recheck labs. Follow up pending reports. - Comprehensive metabolic panel - Lipid panel

## 2016-10-17 ENCOUNTER — Telehealth: Payer: Self-pay

## 2016-10-17 LAB — LIPID PANEL
Chol/HDL Ratio: 4.1 ratio (ref 0.0–5.0)
Cholesterol, Total: 153 mg/dL (ref 100–199)
HDL: 37 mg/dL — AB (ref >39)
LDL Calculated: 93 mg/dL (ref 0–99)
Triglycerides: 115 mg/dL (ref 0–149)
VLDL CHOLESTEROL CAL: 23 mg/dL (ref 5–40)

## 2016-10-17 LAB — COMPREHENSIVE METABOLIC PANEL
ALK PHOS: 79 IU/L (ref 39–117)
ALT: 25 IU/L (ref 0–44)
AST: 24 IU/L (ref 0–40)
Albumin/Globulin Ratio: 1.7 (ref 1.2–2.2)
Albumin: 4.4 g/dL (ref 3.5–5.5)
BILIRUBIN TOTAL: 0.4 mg/dL (ref 0.0–1.2)
BUN/Creatinine Ratio: 14 (ref 9–20)
BUN: 16 mg/dL (ref 6–24)
CO2: 24 mmol/L (ref 20–29)
Calcium: 9.3 mg/dL (ref 8.7–10.2)
Chloride: 101 mmol/L (ref 96–106)
Creatinine, Ser: 1.14 mg/dL (ref 0.76–1.27)
GFR calc Af Amer: 91 mL/min/{1.73_m2} (ref >59)
GFR calc non Af Amer: 78 mL/min/{1.73_m2} (ref >59)
GLUCOSE: 97 mg/dL (ref 65–99)
Globulin, Total: 2.6 g/dL (ref 1.5–4.5)
POTASSIUM: 5.2 mmol/L (ref 3.5–5.2)
Sodium: 141 mmol/L (ref 134–144)
Total Protein: 7 g/dL (ref 6.0–8.5)

## 2016-10-17 NOTE — Telephone Encounter (Signed)
-----   Message from [place], [place] sent at 10/17/2016  9:22 AM EDT ----- Greatly improved cholesterol and triglycerides with the use of the Krill Oil and Pravastatin! Continue the low fat diet and these medications. Recheck in 6 months to assess progress on getting HDL to come up a little higher to reduce risk for cardiovascular events in the future.

## 2016-10-17 NOTE — Telephone Encounter (Signed)
Patient advised. 6 month follow up appointment scheduled. Patient states he will come for a 6 month follow up this time, but after that appointment he only wants to come in yearly.

## 2016-10-20 IMAGING — CR DG LUMBAR SPINE 2-3V
1 series · 3 of 3 positions shown · non-contrast
Comparison: None.

CLINICAL DATA: Low back pain since [REDACTED] at which time the
patient has sustained a unspecified injury, symptoms persist despite
tap tract treatment

EXAM:
LUMBAR SPINE - 2-3 VIEW; LUMBAR SPINE FLEXION AND EXTENSION VIEWS
ONLY

[Series 1: kdxr lumbar spine ap and lateral · 0.14mm/px · 3 of 3 slices shown]
[im 1/3]
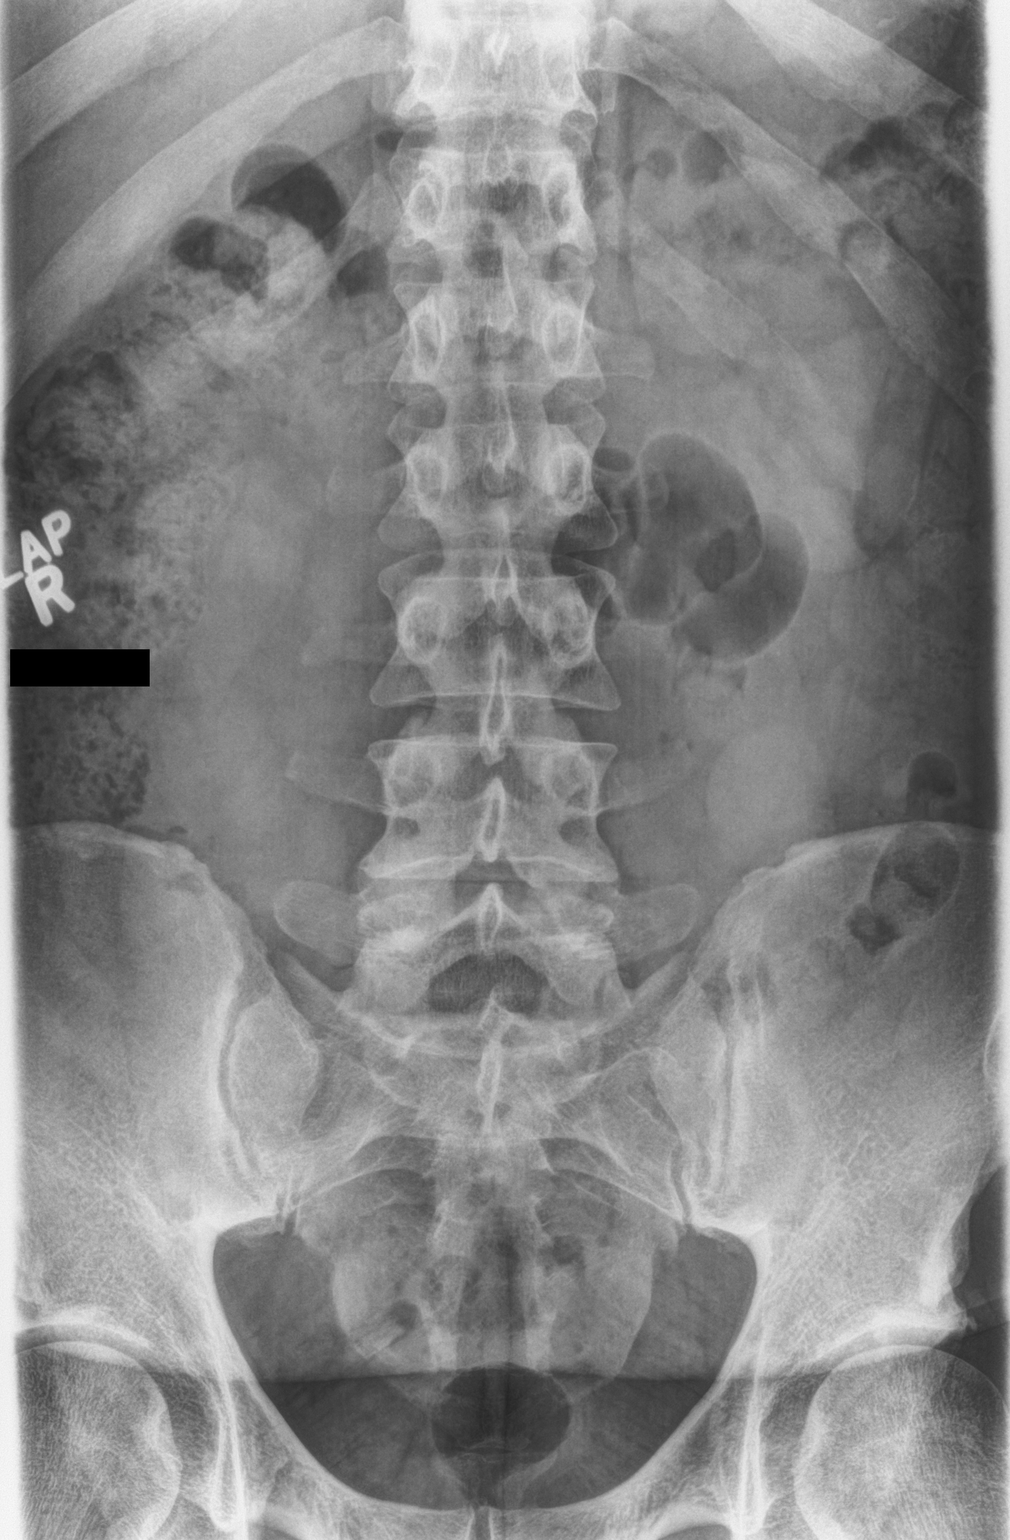
[im 2/3]
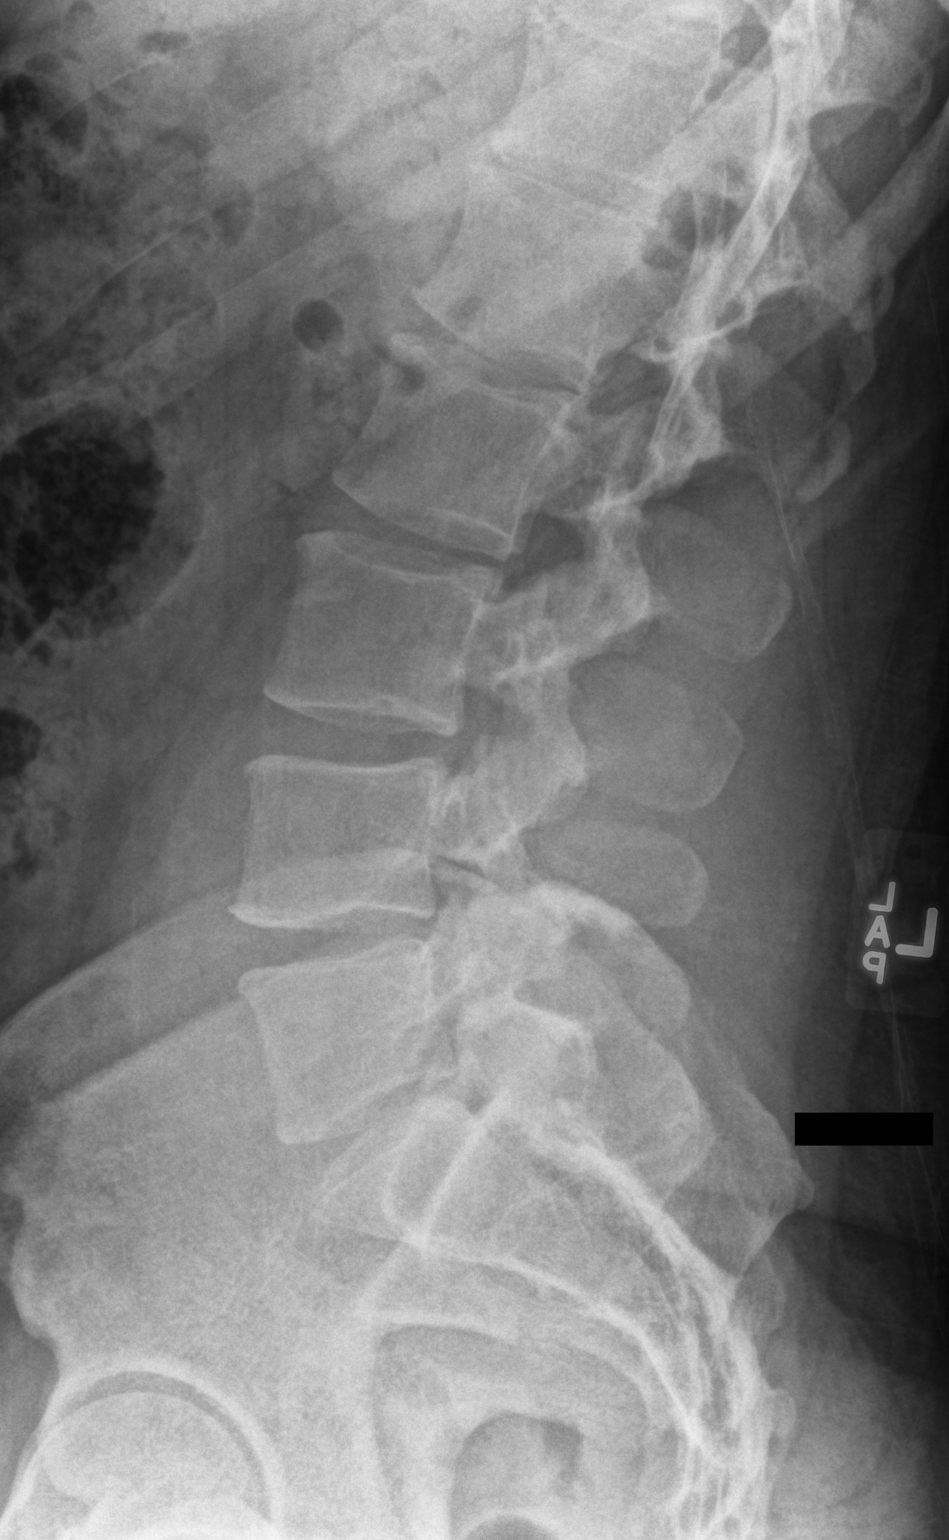
[im 3/3]
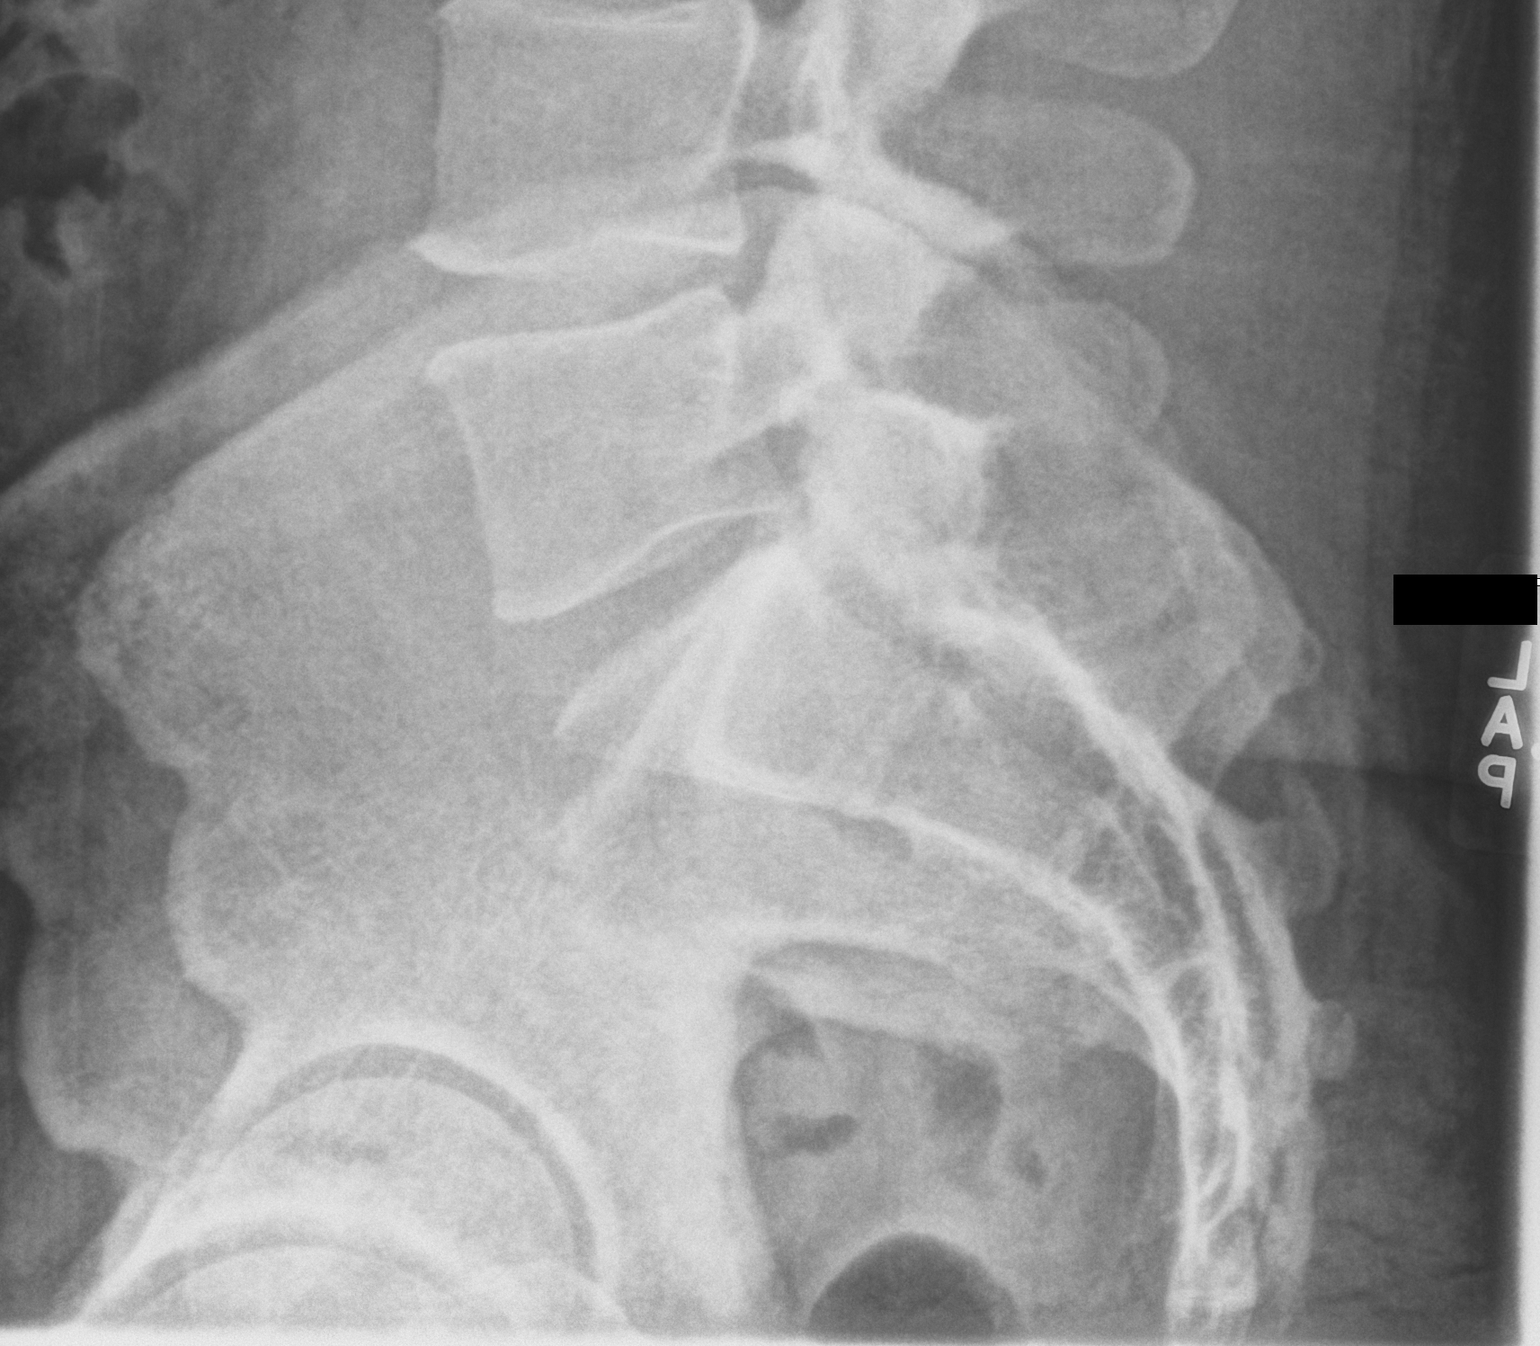

[3 of 3 positions shown; findings below may reference images not displayed]

FINDINGS: The lumbar vertebral bodies are preserved in height. There is mild
disc space narrowing at L4-5. There is no spondylolisthesis. There
is mild facet joint hypertrophy at L4-5 and at L5-S1. The pedicles
and transverse processes are intact. There is no evidence of
ligamentous instability as the patient moves from the flexed to the
extended positions. The observed portions of the sacrum are normal.
IMPRESSION: There is mild degenerative disc space narrowing at L4-5 with facet
joint hypertrophy at L4-5 and L5-S1. There is no compression
fracture nor evidence of ligamentous instability.

## 2016-11-27 ENCOUNTER — Other Ambulatory Visit: Payer: Self-pay | Admitting: Family Medicine

## 2016-11-27 NOTE — Telephone Encounter (Signed)
Paul Shepherd's patient. Last OV 7/9 and last RF 4/9

## 2017-01-14 DIAGNOSIS — Z23 Encounter for immunization: Secondary | ICD-10-CM | POA: Diagnosis not present

## 2017-03-22 ENCOUNTER — Telehealth: Payer: Self-pay | Admitting: Family Medicine

## 2017-03-22 ENCOUNTER — Other Ambulatory Visit: Payer: Self-pay | Admitting: Family Medicine

## 2017-03-22 MED ORDER — LOVASTATIN 20 MG PO TABS: 20.0000 mg | ORAL_TABLET | Freq: Every day | ORAL | 0 refills | Status: DC

## 2017-03-22 NOTE — Telephone Encounter (Signed)
[facility] faxed alternative request.  They are requesting to switch pt to Lovastatin 20 mg tablet from pravastatin (PRAVACHOL) 20 MG tablet that the pt is currently taking due to Lovastatin is a lower cost to pt.  Please advise. Thanks TNP

## 2017-03-22 NOTE — Telephone Encounter (Signed)
[facility]. Patient has a follow up scheduled in January 2019

## 2017-03-22 NOTE — Telephone Encounter (Signed)
May changed to Lovastatin 20 mg qd #90 and recheck lipid panel in 1-2 months.

## 2017-04-19 ENCOUNTER — Ambulatory Visit: Payer: 59 | Admitting: Family Medicine

## 2017-04-19 ENCOUNTER — Encounter: Payer: Self-pay | Admitting: Family Medicine

## 2017-04-19 VITALS — BP 128/82 | HR 59 | Temp 97.9°F | Ht 73.0 in | Wt 288.6 lb

## 2017-04-19 DIAGNOSIS — E782 Mixed hyperlipidemia: Secondary | ICD-10-CM

## 2017-04-19 NOTE — Progress Notes (Signed)
Patient: Paul Shepherd Male    DOB: 01-26-1971   47 y.o.   MRN: [id number] Visit Date: 04/19/2017  Today's Provider: Vernie Murders, PA   Chief Complaint  Patient presents with  . Hyperlipidemia  . Follow-up   Subjective:    HPI  Lipid/Cholesterol, Follow-up:   Last seen for this6 months ago.  Management changes since that visit include changed to Lovastatin 20 mg, advised to continue diet and exercise. . Last Lipid Panel:    Component Value Date/Time   CHOL 153 10/16/2016 0912   TRIG 115 10/16/2016 0912   HDL 37 (L) 10/16/2016 0912   CHOLHDL 4.1 10/16/2016 0912   LDLCALC 93 10/16/2016 0912    He reports good compliance with treatment. He is not having side effects.  Current symptoms include none Weight trend: stable Current diet: no red meat, eating salads, chicken and fish Current exercise: walking  Wt Readings from Last 3 Encounters:  04/19/17 288 lb 9.6 oz (130.9 kg)  10/16/16 282 lb 3.2 oz (128 kg)  07/14/16 286 lb 3.2 oz (129.8 kg)    -------------------------------------------------------------------  Past Medical History:  Diagnosis Date  . Cancer (HCC)    BASAL CELL CARCINOMA  . Sleep apnea    no cpap-pt having UPPP on 10-22-14   Past Surgical History:  Procedure Laterality Date  . BICEPS TENDON REPAIR    . FRACTURE SURGERY     WRIST  . TONSILLECTOMY Bilateral 10/22/2014   Procedure: TONSILLECTOMY;  Surgeon: Carloyn Manner, MD;  Location: [facility];  Service: ENT;  Laterality: Bilateral;  . UVULOPALATOPHARYNGOPLASTY N/A 10/22/2014   Procedure: UVULOPALATOPHARYNGOPLASTY (UPPP);  Surgeon: Carloyn Manner, MD;  Location: [facility];  Service: ENT;  Laterality: N/A;   Family History  Problem Relation Age of Onset  . Cirrhosis Father    No Known Allergies  Current Outpatient Medications:  .  KRILL OIL PO, Take 1 capsule by mouth., Disp: , Rfl:  .  lovastatin (MEVACOR) 20 MG tablet, Take 1 tablet (20 mg total) by mouth at  bedtime., Disp: 90 tablet, Rfl: 0  Review of Systems  Constitutional: Negative.   Respiratory: Negative.   Cardiovascular: Negative.   Gastrointestinal: Negative.   Musculoskeletal: Negative.    Social History   Tobacco Use  . Smoking status: Former Smoker    Packs/day: 1.00    Years: 0.00    Pack years: 0.00    Types: Cigarettes    Last attempt to quit: 10/14/2004    Years since quitting: 12.5  . Smokeless tobacco: Never Used  Substance Use Topics  . Alcohol use: No   Objective:   BP 128/82 (BP Location: Right Arm, Patient Position: Sitting, Cuff Size: Normal)   Pulse (!) 59   Temp 97.9 F (36.6 C) (Oral)   Ht 6\' 1"  (1.854 m)   Wt 288 lb 9.6 oz (130.9 kg)   SpO2 99%   BMI 38.08 kg/m    Physical Exam  Constitutional: He is oriented to person, place, and time. He appears well-developed and well-nourished. No distress.  HENT:  Head: Normocephalic and atraumatic.  Right Ear: Hearing normal.  Left Ear: Hearing normal.  Nose: Nose normal.  Eyes: Conjunctivae and lids are normal. Right eye exhibits no discharge. Left eye exhibits no discharge. No scleral icterus.  Neck: Neck supple.  Cardiovascular: Normal rate and regular rhythm.  Pulmonary/Chest: Effort normal and breath sounds normal. No respiratory distress.  Abdominal: Soft. Bowel sounds are normal.  Musculoskeletal: Normal range  of motion.  Neurological: He is alert and oriented to person, place, and time.  Skin: Skin is intact. No lesion and no rash noted.  Psychiatric: He has a normal mood and affect. His speech is normal and behavior is normal. Thought content normal.      Assessment & Plan:     1. Hyperlipidemia, mixed Tolerating Lovastatin 20 mg qd without side effects. Continues to take [employer] daily and follow low fat diet. Recheck labs and follow up pending reports. - Comprehensive metabolic panel - Lipid panel       Vernie Murders, PA  [facility]

## 2017-04-20 ENCOUNTER — Telehealth: Payer: Self-pay

## 2017-04-20 LAB — COMPREHENSIVE METABOLIC PANEL
ALK PHOS: 77 IU/L (ref 39–117)
ALT: 31 IU/L (ref 0–44)
AST: 23 IU/L (ref 0–40)
Albumin/Globulin Ratio: 1.7 (ref 1.2–2.2)
Albumin: 4.5 g/dL (ref 3.5–5.5)
BUN / CREAT RATIO: 15 (ref 9–20)
BUN: 17 mg/dL (ref 6–24)
Bilirubin Total: 0.3 mg/dL (ref 0.0–1.2)
CO2: 23 mmol/L (ref 20–29)
CREATININE: 1.14 mg/dL (ref 0.76–1.27)
Calcium: 9.7 mg/dL (ref 8.7–10.2)
Chloride: 102 mmol/L (ref 96–106)
GFR calc non Af Amer: 78 mL/min/{1.73_m2} (ref >59)
GFR, EST AFRICAN AMERICAN: 90 mL/min/{1.73_m2} (ref >59)
GLUCOSE: 90 mg/dL (ref 65–99)
Globulin, Total: 2.6 g/dL (ref 1.5–4.5)
Potassium: 4.9 mmol/L (ref 3.5–5.2)
Sodium: 139 mmol/L (ref 134–144)
TOTAL PROTEIN: 7.1 g/dL (ref 6.0–8.5)

## 2017-04-20 LAB — LIPID PANEL
CHOLESTEROL TOTAL: 174 mg/dL (ref 100–199)
Chol/HDL Ratio: 4.7 ratio (ref 0.0–5.0)
HDL: 37 mg/dL — AB (ref >39)
LDL CALC: 106 mg/dL — AB (ref 0–99)
Triglycerides: 156 mg/dL — ABNORMAL HIGH (ref 0–149)
VLDL CHOLESTEROL CAL: 31 mg/dL (ref 5–40)

## 2017-04-20 MED ORDER — LOVASTATIN 40 MG PO TABS: 40.0000 mg | ORAL_TABLET | Freq: Every day | ORAL | 3 refills | Status: DC

## 2017-04-20 NOTE — Telephone Encounter (Signed)
-----   Message from Margo Common, [place] sent at 04/20/2017  8:25 AM EST ----- Normal blood tests except triglycerides and LDL above normal. HDL below normal. Should increase Lovastatin to 40 mg qd and recheck labs in 3 months to assess progress. Continue to exercise 30 minutes 4 days a week and follow a low fat diet.

## 2017-04-20 NOTE — Telephone Encounter (Signed)
Pt advised and RX sent in-Numa Heatwole V Lynise Porr, RMA  

## 2017-05-04 DIAGNOSIS — Z85828 Personal history of other malignant neoplasm of skin: Secondary | ICD-10-CM | POA: Diagnosis not present

## 2017-05-04 DIAGNOSIS — L570 Actinic keratosis: Secondary | ICD-10-CM | POA: Diagnosis not present

## 2017-08-09 DIAGNOSIS — S62512A Displaced fracture of proximal phalanx of left thumb, initial encounter for closed fracture: Secondary | ICD-10-CM | POA: Insufficient documentation

## 2017-09-21 ENCOUNTER — Ambulatory Visit (INDEPENDENT_AMBULATORY_CARE_PROVIDER_SITE_OTHER): Payer: 59 | Admitting: Family Medicine

## 2017-09-21 ENCOUNTER — Encounter: Payer: Self-pay | Admitting: Family Medicine

## 2017-09-21 VITALS — BP 130/82 | HR 63 | Temp 98.1°F | Ht 73.0 in | Wt 273.2 lb

## 2017-09-21 DIAGNOSIS — E782 Mixed hyperlipidemia: Secondary | ICD-10-CM

## 2017-09-21 DIAGNOSIS — Z9889 Other specified postprocedural states: Secondary | ICD-10-CM

## 2017-09-21 DIAGNOSIS — Z0000 Encounter for general adult medical examination without abnormal findings: Secondary | ICD-10-CM

## 2017-09-21 NOTE — Progress Notes (Signed)
Patient: Paul Shepherd, Male    DOB: 23-Apr-1970, 47 y.o.   MRN: [id number] Visit Date: 09/21/2017  Today's Provider: Vernie Murders, PA   Chief Complaint  Patient presents with  . Annual Exam   Subjective:    Annual physical exam Paul Shepherd is a 47 y.o. male who presents today for health maintenance and complete physical. He feels well. He reports exercising daily walking. He reports he is sleeping well. Patient's last OV on 04/19/17 labs showed elevated Triglycerides. Patient advised to increase Lovastatin to 40 mg. Patient states he stopped taking the medication due to muscle pains. He is still taking Fish Oil, Red Yeast Rice and Metamucil.   -----------------------------------------------------------------   Review of Systems  Constitutional: Negative.   HENT: Negative.   Eyes: Negative.   Respiratory: Negative.   Cardiovascular: Negative.   Gastrointestinal: Negative.   Endocrine: Negative.   Genitourinary: Negative.   Musculoskeletal: Negative.   Skin: Negative.   Allergic/Immunologic: Negative.   Neurological: Negative.   Hematological: Negative.   Psychiatric/Behavioral: Negative.    Social History      He  reports that he quit smoking about 12 years ago. His smoking use included cigarettes. He smoked 1.00 pack per day for 0.00 years. He has never used smokeless tobacco. He reports that he does not drink alcohol or use drugs.       Social History   Socioeconomic History  . Marital status: Married    Spouse name: Not on file  . Number of children: Not on file  . Years of education: Not on file  . Highest education level: Not on file  Occupational History  . Not on file  Social Needs  . Financial resource strain: Not on file  . Food insecurity:    Worry: Not on file    Inability: Not on file  . Transportation needs:    Medical: Not on file    Non-medical: Not on file  Tobacco Use  . Smoking status: Former Smoker    Packs/day: 1.00      Years: 0.00    Pack years: 0.00    Types: Cigarettes    Last attempt to quit: 10/14/2004    Years since quitting: 12.9  . Smokeless tobacco: Never Used  Substance and Sexual Activity  . Alcohol use: No  . Drug use: No  . Sexual activity: Not on file  Lifestyle  . Physical activity:    Days per week: Not on file    Minutes per session: Not on file  . Stress: Not on file  Relationships  . Social connections:    Talks on phone: Not on file    Gets together: Not on file    Attends religious service: Not on file    Active member of club or organization: Not on file    Attends meetings of clubs or organizations: Not on file    Relationship status: Not on file  Other Topics Concern  . Not on file  Social History Narrative  . Not on file    Past Medical History:  Diagnosis Date  . Cancer (HCC)    BASAL CELL CARCINOMA  . Sleep apnea    no cpap-pt having UPPP on 10-22-14    Patient Active Problem List   Diagnosis Date Noted  . Closed fracture of proximal phalanx of left thumb 08/09/2017  . Hyperlipidemia, mixed 07/14/2016  . S/P uvulopalatopharyngoplasty 10/22/2014  . CHEST PAIN, ATYPICAL 07/16/2008    Past  Surgical History:  Procedure Laterality Date  . BICEPS TENDON REPAIR    . FRACTURE SURGERY     WRIST  . TONSILLECTOMY Bilateral 10/22/2014   Procedure: TONSILLECTOMY;  Surgeon: Carloyn Manner, MD;  Location: [facility] ORS;  Service: ENT;  Laterality: Bilateral;  . UVULOPALATOPHARYNGOPLASTY N/A 10/22/2014   Procedure: UVULOPALATOPHARYNGOPLASTY (UPPP);  Surgeon: Carloyn Manner, MD;  Location: [facility] ORS;  Service: ENT;  Laterality: N/A;   Family History        Family Status  Relation Name Status  . Father  Deceased        His family history includes Cirrhosis in his father.     Allergies  Allergen Reactions  . Lovastatin     Muscle pain     Current Outpatient Medications:  .  KRILL OIL PO, Take 1 capsule by mouth., Disp: , Rfl:  .  psyllium (METAMUCIL) 58.6 %  powder, Take 1 packet by mouth daily., Disp: , Rfl:  .  Red Yeast Rice Extract 600 MG CAPS, Take by mouth., Disp: , Rfl:    Patient Care Team: Chrismon, Vickki Muff, PA as PCP - General (Physician Assistant)      Objective:   Vitals: BP 130/82 (BP Location: Right Arm, Patient Position: Sitting, Cuff Size: Large)   Pulse 63   Temp 98.1 F (36.7 C) (Oral)   Ht 6\' 1"  (1.854 m)   Wt 273 lb 3.2 oz (123.9 kg)   SpO2 98%   BMI 36.04 kg/m    Vitals:   09/21/17 1458  BP: 130/82  Pulse: 63  Temp: 98.1 F (36.7 C)  TempSrc: Oral  SpO2: 98%  Weight: 273 lb 3.2 oz (123.9 kg)  Height: 6\' 1"  (1.854 m)    Wt Readings from Last 3 Encounters:  09/21/17 273 lb 3.2 oz (123.9 kg)  04/19/17 288 lb 9.6 oz (130.9 kg)  10/16/16 282 lb 3.2 oz (128 kg)   Physical Exam  Constitutional: He is oriented to person, place, and time. He appears well-developed and well-nourished.  HENT:  Head: Normocephalic and atraumatic.  Right Ear: External ear normal.  Left Ear: External ear normal.  Nose: Nose normal.  Mouth/Throat: Oropharynx is clear and moist.  Eyes: Pupils are equal, round, and reactive to light. Conjunctivae and EOM are normal. Right eye exhibits no discharge.  Neck: Normal range of motion. Neck supple. No tracheal deviation present. No thyromegaly present.  Cardiovascular: Normal rate, regular rhythm, normal heart sounds and intact distal pulses.  No murmur heard. Pulmonary/Chest: Effort normal and breath sounds normal. No respiratory distress. He has no wheezes. He has no rales. He exhibits no tenderness.  Abdominal: Soft. He exhibits no distension and no mass. There is no tenderness. There is no rebound and no guarding.  Musculoskeletal: Normal range of motion. He exhibits no edema or tenderness.  Lymphadenopathy:    He has no cervical adenopathy.  Neurological: He is alert and oriented to person, place, and time. He has normal reflexes. He displays normal reflexes. No cranial nerve  deficit. He exhibits normal muscle tone. Coordination normal.  Skin: Skin is warm and dry. No rash noted. No erythema.  Psychiatric: He has a normal mood and affect. His behavior is normal. Judgment and thought content normal.    Depression Screen PHQ 2/9 Scores 09/21/2017 10/16/2016 05/12/2016  PHQ - 2 Score 0 0 0  PHQ- 9 Score 0 0 -   Assessment & Plan:     Routine Health Maintenance and Physical Exam  Exercise Activities and  Dietary recommendations Goals    Continues to do a lot of yard work at home and walking if no firewood to cut.      Immunization History  Administered Date(s) Administered  . Influenza-Unspecified 12/10/2015, 01/14/2017  . Td 10/27/2004, 05/12/2016    Health Maintenance  Topic Date Due  . INFLUENZA VACCINE  11/08/2017  . TETANUS/TDAP  05/12/2026  . HIV Screening  Completed    Discussed health benefits of physical activity, and encouraged him to engage in regular exercise appropriate for his age and condition.    --------------------------------------------------------------------  1. Annual physical exam Very good general health. BMI does not match thick bone structure and large muscle mass. Continues to maintain daily physical activity and low fat diet. Immunizations up to date. Given anticipatory guidance and will get screening labs. Follow up pending reports. - CBC with Differential/Platelet - Comprehensive metabolic panel - Lipid panel - TSH  2. Hyperlipidemia, mixed Has muscle pain/aches from use of Lovastatin in January 2019. Changed to Red Yeast Rice and Krill Oil without return of side effects. Continues to exercise by working in the yard at home daily. Will recheck labs to see if Zetia needed. Continue low fat diet. - CBC with Differential/Platelet - Comprehensive metabolic panel - TSH  3. S/P uvulopalatopharyngoplasty Had UPP surgery by Dr. Pryor Ochoa (ENT) in 2016 for sleep apnea. Sleeping well without snoring and no longer needs the CPAP.     Vernie Murders, PA  [facility]

## 2017-09-22 LAB — CBC WITH DIFFERENTIAL/PLATELET
BASOS: 0 %
Basophils Absolute: 0 10*3/uL (ref 0.0–0.2)
EOS (ABSOLUTE): 0 10*3/uL (ref 0.0–0.4)
EOS: 0 %
HEMATOCRIT: 42.8 % (ref 37.5–51.0)
HEMOGLOBIN: 14.6 g/dL (ref 13.0–17.7)
IMMATURE GRANS (ABS): 0 10*3/uL (ref 0.0–0.1)
IMMATURE GRANULOCYTES: 0 %
LYMPHS: 38 %
Lymphocytes Absolute: 2.1 10*3/uL (ref 0.7–3.1)
MCH: 29.4 pg (ref 26.6–33.0)
MCHC: 34.1 g/dL (ref 31.5–35.7)
MCV: 86 fL (ref 79–97)
MONOCYTES: 7 %
Monocytes Absolute: 0.4 10*3/uL (ref 0.1–0.9)
NEUTROS ABS: 3 10*3/uL (ref 1.4–7.0)
NEUTROS PCT: 55 %
Platelets: 240 10*3/uL (ref 150–450)
RBC: 4.97 x10E6/uL (ref 4.14–5.80)
RDW: 14.3 % (ref 12.3–15.4)
WBC: 5.6 10*3/uL (ref 3.4–10.8)

## 2017-09-22 LAB — COMPREHENSIVE METABOLIC PANEL
A/G RATIO: 1.8 (ref 1.2–2.2)
ALBUMIN: 4.6 g/dL (ref 3.5–5.5)
ALT: 25 IU/L (ref 0–44)
AST: 26 IU/L (ref 0–40)
Alkaline Phosphatase: 81 IU/L (ref 39–117)
BILIRUBIN TOTAL: 0.6 mg/dL (ref 0.0–1.2)
BUN / CREAT RATIO: 12 (ref 9–20)
BUN: 12 mg/dL (ref 6–24)
CALCIUM: 9.8 mg/dL (ref 8.7–10.2)
CHLORIDE: 101 mmol/L (ref 96–106)
CO2: 23 mmol/L (ref 20–29)
Creatinine, Ser: 1.04 mg/dL (ref 0.76–1.27)
GFR, EST AFRICAN AMERICAN: 100 mL/min/{1.73_m2} (ref >59)
GFR, EST NON AFRICAN AMERICAN: 87 mL/min/{1.73_m2} (ref >59)
Globulin, Total: 2.6 g/dL (ref 1.5–4.5)
Glucose: 85 mg/dL (ref 65–99)
POTASSIUM: 4.3 mmol/L (ref 3.5–5.2)
Sodium: 138 mmol/L (ref 134–144)
TOTAL PROTEIN: 7.2 g/dL (ref 6.0–8.5)

## 2017-09-22 LAB — LIPID PANEL
CHOL/HDL RATIO: 5 ratio (ref 0.0–5.0)
Cholesterol, Total: 196 mg/dL (ref 100–199)
HDL: 39 mg/dL — AB (ref >39)
LDL Calculated: 129 mg/dL — ABNORMAL HIGH (ref 0–99)
Triglycerides: 140 mg/dL (ref 0–149)
VLDL CHOLESTEROL CAL: 28 mg/dL (ref 5–40)

## 2017-09-22 LAB — TSH: TSH: 1.2 u[IU]/mL (ref 0.450–4.500)

## 2018-03-13 NOTE — Progress Notes (Signed)
Patient: Paul Shepherd Male    DOB: 12-Jan-1971   47 y.o.   MRN: [id number] Visit Date: 03/15/2018  Today's Provider: Vernie Murders, PA   Chief Complaint  Patient presents with  . Follow-up  . Hyperlipidemia   Subjective:    HPI   Lipid/Cholesterol, Follow-up:   Last seen for this 6 months ago.  Management since that visit includes; labs checked, no changes. Advised he may add Co-Q 10 qd to the [employer] and [employer] Rice supplements with a low fat diet..  Last Lipid Panel:    Component Value Date/Time   CHOL 196 09/21/2017 1615   TRIG 140 09/21/2017 1615   HDL 39 (L) 09/21/2017 1615   CHOLHDL 5.0 09/21/2017 1615   LDLCALC 129 (H) 09/21/2017 1615    He reports good compliance with treatment. He is not having side effects. none  Wt Readings from Last 3 Encounters:  03/15/18 286 lb (129.7 kg)  09/21/17 273 lb 3.2 oz (123.9 kg)  04/19/17 288 lb 9.6 oz (130.9 kg)    --------------------------------------------------------------- Past Medical History:  Diagnosis Date  . Cancer (HCC)    BASAL CELL CARCINOMA  . Sleep apnea    no cpap-pt having UPPP on 10-22-14   Past Surgical History:  Procedure Laterality Date  . BICEPS TENDON REPAIR    . FRACTURE SURGERY     WRIST  . TONSILLECTOMY Bilateral 10/22/2014   Procedure: TONSILLECTOMY;  Surgeon: Carloyn Manner, MD;  Location: [facility] ORS;  Service: ENT;  Laterality: Bilateral;  . UVULOPALATOPHARYNGOPLASTY N/A 10/22/2014   Procedure: UVULOPALATOPHARYNGOPLASTY (UPPP);  Surgeon: Carloyn Manner, MD;  Location: [facility] OR[facility];  Service: ENT;  Laterality: N/A;   Family History  Problem Relation Age of Onset  . Cirrhosis Father    Allergies  Allergen Reactions  . Lovastatin     Muscle pain     Current Outpatient Medications:  .  KRILL OIL PO, Take 1 capsule by mouth., Disp: , Rfl:  .  psyllium (METAMUCIL) 58.6 % powder, Take 1 packet by mouth daily., Disp: , Rfl:  .  Red Yeast Rice Extract 600 MG CAPS,  Take by mouth., Disp: , Rfl:   Review of Systems  Constitutional: Negative for appetite change, chills and fever.  Respiratory: Negative for chest tightness, shortness of breath and wheezing.   Cardiovascular: Negative for chest pain and palpitations.  Gastrointestinal: Negative for abdominal pain, nausea and vomiting.   Social History   Tobacco Use  . Smoking status: Former Smoker    Packs/day: 1.00    Years: 0.00    Pack years: 0.00    Types: Cigarettes    Last attempt to quit: 10/14/2004    Years since quitting: 13.4  . Smokeless tobacco: Never Used  Substance Use Topics  . Alcohol use: No   Objective:   BP 134/81 (BP Location: Right Arm, Patient Position: Sitting, Cuff Size: Large)   Pulse 98   Temp 98 F (36.7 C) (Oral)   Resp 16   Ht 6\' 1"  (1.854 m)   Wt 286 lb (129.7 kg)   SpO2 98%   BMI 37.73 kg/m  Vitals:   03/15/18 0837  BP: 134/81  Pulse: 98  Resp: 16  Temp: 98 F (36.7 C)  TempSrc: Oral  SpO2: 98%  Weight: 286 lb (129.7 kg)  Height: 6\' 1"  (1.854 m)   Physical Exam  Constitutional: He is oriented to person, place, and time. He appears well-developed and well-nourished. No distress.  HENT:  Head: Normocephalic and atraumatic.  Right Ear: Hearing normal.  Left Ear: Hearing normal.  Nose: Nose normal.  Eyes: Conjunctivae and lids are normal. Right eye exhibits no discharge. Left eye exhibits no discharge. No scleral icterus.  Neck: Neck supple.  Cardiovascular: Normal rate and regular rhythm.  Pulmonary/Chest: Effort normal and breath sounds normal. No respiratory distress.  Abdominal: Soft. Bowel sounds are normal.  Musculoskeletal: Normal range of motion.  Very muscular.  Neurological: He is alert and oriented to person, place, and time.  Skin: Skin is intact. No lesion and no rash noted.  Psychiatric: He has a normal mood and affect. His speech is normal and behavior is normal. Thought content normal.      Assessment & Plan:     1.  Hyperlipidemia, mixed Tolerating the low fat diet, Red Yeast Rice, CoQ-10 and Krill Oil daily. Six months ago, HDL was low and LDL high but triglycerides were back to normal. Recheck CBC, CMP and Lipid Panel. Follow up pending reports. Lipid Panel     Component Value Date/Time   CHOL 196 09/21/2017 1615   TRIG 140 09/21/2017 1615   HDL 39 (L) 09/21/2017 1615   CHOLHDL 5.0 09/21/2017 1615   LDLCALC 129 (H) 09/21/2017 1615   - CBC with Differential/Platelet - Comprehensive metabolic panel - Lipid panel  2. BMI 37.0-37.9, adult Very muscular with large frame and heavy workouts doing yard work with cutting firewood. Will recheck CBC and CMP for any metabolic imbalances.  - CBC with Differential/Platelet - Comprehensive metabolic panel       Vernie Murders, PA  [facility]

## 2018-03-15 ENCOUNTER — Ambulatory Visit: Payer: 59 | Admitting: Family Medicine

## 2018-03-15 ENCOUNTER — Encounter: Payer: Self-pay | Admitting: Family Medicine

## 2018-03-15 VITALS — BP 134/81 | HR 98 | Temp 98.0°F | Resp 16 | Ht 73.0 in | Wt 286.0 lb

## 2018-03-15 DIAGNOSIS — Z6837 Body mass index (BMI) 37.0-37.9, adult: Secondary | ICD-10-CM

## 2018-03-15 DIAGNOSIS — E782 Mixed hyperlipidemia: Secondary | ICD-10-CM | POA: Diagnosis not present

## 2018-03-16 LAB — CBC WITH DIFFERENTIAL/PLATELET
BASOS ABS: 0.1 10*3/uL (ref 0.0–0.2)
Basos: 1 %
EOS (ABSOLUTE): 0 10*3/uL (ref 0.0–0.4)
EOS: 1 %
HEMOGLOBIN: 14.6 g/dL (ref 13.0–17.7)
Hematocrit: 43.8 % (ref 37.5–51.0)
IMMATURE GRANS (ABS): 0 10*3/uL (ref 0.0–0.1)
Immature Granulocytes: 0 %
Lymphocytes Absolute: 2 10*3/uL (ref 0.7–3.1)
Lymphs: 42 %
MCH: 29.4 pg (ref 26.6–33.0)
MCHC: 33.3 g/dL (ref 31.5–35.7)
MCV: 88 fL (ref 79–97)
MONOS ABS: 0.4 10*3/uL (ref 0.1–0.9)
Monocytes: 9 %
Neutrophils Absolute: 2.3 10*3/uL (ref 1.4–7.0)
Neutrophils: 47 %
PLATELETS: 268 10*3/uL (ref 150–450)
RBC: 4.97 x10E6/uL (ref 4.14–5.80)
RDW: 12.9 % (ref 12.3–15.4)
WBC: 4.8 10*3/uL (ref 3.4–10.8)

## 2018-03-16 LAB — COMPREHENSIVE METABOLIC PANEL
ALBUMIN: 4.5 g/dL (ref 3.5–5.5)
ALK PHOS: 81 IU/L (ref 39–117)
ALT: 30 IU/L (ref 0–44)
AST: 26 IU/L (ref 0–40)
Albumin/Globulin Ratio: 1.7 (ref 1.2–2.2)
BUN / CREAT RATIO: 14 (ref 9–20)
BUN: 15 mg/dL (ref 6–24)
Bilirubin Total: 0.5 mg/dL (ref 0.0–1.2)
CHLORIDE: 100 mmol/L (ref 96–106)
CO2: 22 mmol/L (ref 20–29)
CREATININE: 1.11 mg/dL (ref 0.76–1.27)
Calcium: 9.7 mg/dL (ref 8.7–10.2)
GFR, EST AFRICAN AMERICAN: 92 mL/min/{1.73_m2} (ref >59)
GFR, EST NON AFRICAN AMERICAN: 80 mL/min/{1.73_m2} (ref >59)
Globulin, Total: 2.6 g/dL (ref 1.5–4.5)
Glucose: 93 mg/dL (ref 65–99)
Potassium: 4.7 mmol/L (ref 3.5–5.2)
Sodium: 137 mmol/L (ref 134–144)
Total Protein: 7.1 g/dL (ref 6.0–8.5)

## 2018-03-16 LAB — LIPID PANEL
Chol/HDL Ratio: 5.1 ratio — ABNORMAL HIGH (ref 0.0–5.0)
Cholesterol, Total: 195 mg/dL (ref 100–199)
HDL: 38 mg/dL — AB (ref >39)
LDL Calculated: 130 mg/dL — ABNORMAL HIGH (ref 0–99)
Triglycerides: 135 mg/dL (ref 0–149)
VLDL Cholesterol Cal: 27 mg/dL (ref 5–40)

## 2018-03-18 ENCOUNTER — Telehealth: Payer: Self-pay | Admitting: *Deleted

## 2018-03-18 NOTE — Telephone Encounter (Signed)
No answer and no vm. Will try again later.  

## 2018-03-18 NOTE — Telephone Encounter (Signed)
-----   Message from Margo Common, [place] sent at 03/16/2018  9:19 AM EST ----- LDL and HDL cholesterol showing no improvement and risk ratio in up a little (5.1). Increase Red Yeast Rice and Co-Q 10 to twice a day. Reduce fats and any empty calories like alcohol. If this increase dose not show improvement in the next 3 months, will probably need to switch to a statin drug. Recheck levels in 3 months.

## 2018-03-20 NOTE — Telephone Encounter (Signed)
Patient was notified of results. Expressed understanding.  

## 2019-02-17 ENCOUNTER — Telehealth: Payer: Self-pay

## 2019-02-17 NOTE — Telephone Encounter (Signed)
Pt's wife Levada Dy stated that Simona Huh advised pt to take OTC Co Q 10 and wife wanted to know if pt should take that with or without food. Please advise. Thanks TNP

## 2019-02-17 NOTE — Telephone Encounter (Signed)
Advised 

## 2019-07-21 ENCOUNTER — Telehealth: Payer: Self-pay

## 2019-07-21 NOTE — Telephone Encounter (Signed)
LMTCB, [facility] Triage Nurse may give patient results  

## 2019-07-21 NOTE — Telephone Encounter (Signed)
Copied from [place] [id number]. Topic: General - Other >> Jul 21, 2019  9:07 AM Alanda Slim E wrote: Reason for CRM: Pt spouse schedule cpe appt for this Friday but wants to know he if needs to have blood work done before/ she states usually Simona Huh wants to see the blood work Derrek Monaco call and advise if the Friday cpe appt is fine or if blood work is wanted first

## 2019-07-21 NOTE — Telephone Encounter (Signed)
Pt.'s wife called back and given message. Verbalizes understanding.

## 2019-07-21 NOTE — Telephone Encounter (Signed)
The last CPE and follow up of hyperlipidemia, labs were done fasting after the office visit in case the examination indicated need for additional tests.

## 2019-07-24 NOTE — Progress Notes (Signed)
Complete physical exam      I,April Miller,acting as a scribe for [employer], PA.,have documented all relevant documentation on the behalf of [employer], PA,as directed by  [employer], PA while in the presence of [employer], [place].  Patient: Paul Shepherd   DOB: 1970-11-03   49 y.o. Male  MRN: [id number] Visit Date: 07/25/2019  Today's healthcare provider: Vernie Murders, PA  Subjective:    Chief Complaint  Patient presents with  . Annual Exam    Paul Shepherd is a 49 y.o. male who presents today for a complete physical exam.  He reports consuming a Healthy diet. The patient does not participate in regular exercise at present. He generally feels well. He reports sleeping well. He does not have additional problems to discuss today.   Past Medical History:  Diagnosis Date  . Cancer (HCC)    BASAL CELL CARCINOMA  . Sleep apnea    no cpap-pt having UPPP on 10-22-14   Past Surgical History:  Procedure Laterality Date  . BICEPS TENDON REPAIR    . FRACTURE SURGERY     WRIST  . TONSILLECTOMY Bilateral 10/22/2014   Procedure: TONSILLECTOMY;  Surgeon: Carloyn Manner, MD;  Location: [facility] ORS;  Service: ENT;  Laterality: Bilateral;  . UVULOPALATOPHARYNGOPLASTY N/A 10/22/2014   Procedure: UVULOPALATOPHARYNGOPLASTY (UPPP);  Surgeon: Carloyn Manner, MD;  Location: [facility] OR[facility];  Service: ENT;  Laterality: N/A;   Social History   Socioeconomic History  . Marital status: Married    Spouse name: Not on file  . Number of children: Not on file  . Years of education: Not on file  . Highest education level: Not on file  Occupational History  . Not on file  Tobacco Use  . Smoking status: Former Smoker    Packs/day: 1.00    Years: 0.00    Pack years: 0.00    Types: Cigarettes    Quit date: 10/14/2004    Years since quitting: 14.7  . Smokeless tobacco: Never Used  Substance and Sexual Activity  . Alcohol use: No  . Drug use: No  . Sexual activity:  Not on file  Other Topics Concern  . Not on file  Social History Narrative  . Not on file   Social Determinants of Health   Financial Resource Strain:   . Difficulty of Paying Living Expenses:   Food Insecurity:   . Worried About Charity fundraiser in the Last Year:   . Arboriculturist in the Last Year:   Transportation Needs:   . Film/video editor (Medical):   Marland Kitchen Lack of Transportation (Non-Medical):   Physical Activity:   . Days of Exercise per Week:   . Minutes of Exercise per Session:   Stress:   . Feeling of Stress :   Social Connections:   . Frequency of Communication with Friends and Family:   . Frequency of Social Gatherings with Friends and Family:   . Attends Religious Services:   . Active Member of Clubs or Organizations:   . Attends Archivist Meetings:   Marland Kitchen Marital Status:   Intimate Partner Violence:   . Fear of Current or Ex-Partner:   . Emotionally Abused:   Marland Kitchen Physically Abused:   . Sexually Abused:    Family Status  Relation Name Status  . Father  Deceased   Family History  Problem Relation Age of Onset  . Cirrhosis Father    Allergies  Allergen Reactions  .  Lovastatin     Muscle pain     Patient Care Team: Cianni Manny, Vickki Muff, PA as PCP - General (Physician Assistant)   Medications: Outpatient Medications Prior to Visit  Medication Sig  . KRILL OIL PO Take 1 capsule by mouth.  . psyllium (METAMUCIL) 58.6 % powder Take 1 packet by mouth daily.  . Red Yeast Rice Extract 600 MG CAPS Take by mouth.   No facility-administered medications prior to visit.    Review of Systems  Constitutional: Negative.   HENT: Negative.   Eyes: Negative.   Respiratory: Negative.   Cardiovascular: Negative.   Gastrointestinal: Negative.   Endocrine: Negative.   Genitourinary: Negative.   Musculoskeletal: Negative.   Skin: Negative.   Allergic/Immunologic: Negative.   Neurological: Negative.   Hematological: Negative.     Psychiatric/Behavioral: Negative.         Objective:    BP 135/82 (BP Location: Right Arm, Patient Position: Sitting, Cuff Size: Large)   Pulse (!) 52   Temp (!) 96.8 F (36 C) (Other (Comment))   Resp 16   Ht 6\' 1"  (1.854 m)   Wt 284 lb (128.8 kg)   SpO2 97%   BMI 37.47 kg/m  BP Readings from Last 3 Encounters:  07/25/19 135/82  03/15/18 134/81  09/21/17 130/82   Wt Readings from Last 3 Encounters:  07/25/19 284 lb (128.8 kg)  03/15/18 286 lb (129.7 kg)  09/21/17 273 lb 3.2 oz (123.9 kg)      Physical Exam Constitutional:      Appearance: Normal appearance. He is normal weight.  HENT:     Head: Normocephalic and atraumatic.     Right Ear: Tympanic membrane, ear canal and external ear normal.     Left Ear: Tympanic membrane, ear canal and external ear normal.     Nose: Nose normal.     Mouth/Throat:     Mouth: Mucous membranes are moist.     Pharynx: Oropharynx is clear.  Eyes:     Extraocular Movements: Extraocular movements intact.     Conjunctiva/sclera: Conjunctivae normal.     Pupils: Pupils are equal, round, and reactive to light.  Cardiovascular:     Rate and Rhythm: Normal rate and regular rhythm.     Pulses: Normal pulses.     Heart sounds: Normal heart sounds.  Pulmonary:     Effort: Pulmonary effort is normal.     Breath sounds: Normal breath sounds.  Abdominal:     General: Abdomen is flat. Bowel sounds are normal.     Palpations: Abdomen is soft.  Genitourinary:    Penis: Normal.      Testes: Normal.     Prostate: Normal.     Rectum: Normal.  Musculoskeletal:        General: Normal range of motion.     Cervical back: Normal range of motion and neck supple.  Skin:    General: Skin is warm and dry.  Neurological:     General: No focal deficit present.     Mental Status: He is alert and oriented to person, place, and time. Mental status is at baseline.  Psychiatric:        Mood and Affect: Mood normal.        Behavior: Behavior normal.         Thought Content: Thought content normal.        Judgment: Judgment normal.    Depression Screen  PHQ 2/9 Scores 07/25/2019 09/21/2017 10/16/2016  PHQ - 2  Score 0 0 0  PHQ- 9 Score 0 0 0        Assessment & Plan:    Routine Health Maintenance and Physical Exam  Exercise Activities and Dietary recommendations Goals   Continue low fat diet and reduced caloric intake. Given cholesterol food list and encouraged to exercise 30-40 minutes 3-4 days a week     Immunization History  Administered Date(s) Administered  . Influenza,inj,Quad PF,6+ Mos 02/22/2018  . Influenza-Unspecified 12/10/2015, 01/14/2017  . Td 10/27/2004, 05/12/2016    Health Maintenance  Topic Date Due  . INFLUENZA VACCINE  11/09/2019  . TETANUS/TDAP  05/12/2026  . HIV Screening  Completed    Discussed health benefits of physical activity, and encouraged him to engage in regular exercise appropriate for his age and condition.  1. Annual physical exam Good general health. Immunizations up to date (refuses COVID vaccination). Given anticipatory counseling. - Lipid panel - TSH - CBC w/Diff/Platelet - Comprehensive Metabolic Panel (CMET) - PSA - Hemoglobin A1C  2. Hyperlipidemia, mixed Still taking Red Yeast Rice, Metamucil and Omega-3 Fish Oil daily. Given cholesterol food list and should work on weight control with regular exercise. Will check routine labs. - Lipid panel - TSH - CBC w/Diff/Platelet - Comprehensive Metabolic Panel (CMET) - Hemoglobin A1C  3. BMI 37.0-37.9, adult Has a heavy musculature and large bone frame. Will check for signs of diabetes or thyroid disorder. - Lipid panel - TSH - CBC w/Diff/Platelet - Comprehensive Metabolic Panel (CMET) - Hemoglobin A1C  4. Screening for prostate cancer No nocturia, urinary frequency or decreased stream. No family history of prostate cancer. Will get routine labs and baseline PSA. DRE unremarkable - Lipid panel - TSH - CBC  w/Diff/Platelet - Comprehensive Metabolic Panel (CMET) - PSA        I, Ison Wichmann, PA, have reviewed all documentation for this visit. The documentation on 07/25/19 for the exam, diagnosis, procedures, and orders are all accurate and complete.    Vernie Murders, [place] [phone] (phone) [phone] (fax)  [place]

## 2019-07-25 ENCOUNTER — Encounter: Payer: Self-pay | Admitting: Family Medicine

## 2019-07-25 ENCOUNTER — Ambulatory Visit (INDEPENDENT_AMBULATORY_CARE_PROVIDER_SITE_OTHER): Payer: 59 | Admitting: Family Medicine

## 2019-07-25 ENCOUNTER — Other Ambulatory Visit: Payer: Self-pay

## 2019-07-25 VITALS — BP 135/82 | HR 52 | Temp 96.8°F | Resp 16 | Ht 73.0 in | Wt 284.0 lb

## 2019-07-25 DIAGNOSIS — Z0000 Encounter for general adult medical examination without abnormal findings: Secondary | ICD-10-CM

## 2019-07-25 DIAGNOSIS — Z125 Encounter for screening for malignant neoplasm of prostate: Secondary | ICD-10-CM | POA: Diagnosis not present

## 2019-07-25 DIAGNOSIS — Z6837 Body mass index (BMI) 37.0-37.9, adult: Secondary | ICD-10-CM | POA: Diagnosis not present

## 2019-07-25 DIAGNOSIS — E782 Mixed hyperlipidemia: Secondary | ICD-10-CM | POA: Diagnosis not present

## 2019-07-25 NOTE — Patient Instructions (Signed)

## 2019-07-26 LAB — COMPREHENSIVE METABOLIC PANEL
ALT: 26 IU/L (ref 0–44)
AST: 24 IU/L (ref 0–40)
Albumin/Globulin Ratio: 1.9 (ref 1.2–2.2)
Albumin: 4.6 g/dL (ref 4.0–5.0)
Alkaline Phosphatase: 88 IU/L (ref 39–117)
BUN/Creatinine Ratio: 15 (ref 9–20)
BUN: 15 mg/dL (ref 6–24)
Bilirubin Total: 0.4 mg/dL (ref 0.0–1.2)
CO2: 21 mmol/L (ref 20–29)
Calcium: 9.6 mg/dL (ref 8.7–10.2)
Chloride: 101 mmol/L (ref 96–106)
Creatinine, Ser: 1.02 mg/dL (ref 0.76–1.27)
GFR calc Af Amer: 101 mL/min/{1.73_m2} (ref >59)
GFR calc non Af Amer: 88 mL/min/{1.73_m2} (ref >59)
Globulin, Total: 2.4 g/dL (ref 1.5–4.5)
Glucose: 90 mg/dL (ref 65–99)
Potassium: 4.7 mmol/L (ref 3.5–5.2)
Sodium: 138 mmol/L (ref 134–144)
Total Protein: 7 g/dL (ref 6.0–8.5)

## 2019-07-26 LAB — LIPID PANEL
Chol/HDL Ratio: 4.7 ratio (ref 0.0–5.0)
Cholesterol, Total: 179 mg/dL (ref 100–199)
HDL: 38 mg/dL — ABNORMAL LOW (ref >39)
LDL Chol Calc (NIH): 116 mg/dL — ABNORMAL HIGH (ref 0–99)
Triglycerides: 137 mg/dL (ref 0–149)
VLDL Cholesterol Cal: 25 mg/dL (ref 5–40)

## 2019-07-26 LAB — PSA: Prostate Specific Ag, Serum: 0.7 ng/mL (ref 0.0–4.0)

## 2019-07-26 LAB — CBC WITH DIFFERENTIAL/PLATELET
Basophils Absolute: 0.1 10*3/uL (ref 0.0–0.2)
Basos: 1 %
EOS (ABSOLUTE): 0.1 10*3/uL (ref 0.0–0.4)
Eos: 1 %
Hematocrit: 44.2 % (ref 37.5–51.0)
Hemoglobin: 15 g/dL (ref 13.0–17.7)
Immature Grans (Abs): 0 10*3/uL (ref 0.0–0.1)
Immature Granulocytes: 0 %
Lymphocytes Absolute: 2.2 10*3/uL (ref 0.7–3.1)
Lymphs: 39 %
MCH: 29.7 pg (ref 26.6–33.0)
MCHC: 33.9 g/dL (ref 31.5–35.7)
MCV: 88 fL (ref 79–97)
Monocytes Absolute: 0.5 10*3/uL (ref 0.1–0.9)
Monocytes: 9 %
Neutrophils Absolute: 2.8 10*3/uL (ref 1.4–7.0)
Neutrophils: 50 %
Platelets: 241 10*3/uL (ref 150–450)
RBC: 5.05 x10E6/uL (ref 4.14–5.80)
RDW: 13 % (ref 11.6–15.4)
WBC: 5.7 10*3/uL (ref 3.4–10.8)

## 2019-07-26 LAB — HEMOGLOBIN A1C
Est. average glucose Bld gHb Est-mCnc: 114 mg/dL
Hgb A1c MFr Bld: 5.6 % (ref 4.8–5.6)

## 2019-07-26 LAB — TSH: TSH: 1.68 u[IU]/mL (ref 0.450–4.500)

## 2020-01-03 ENCOUNTER — Telehealth: Payer: Self-pay | Admitting: Unknown Physician Specialty

## 2020-01-03 NOTE — Telephone Encounter (Signed)
I connected by phone with Paul Shepherd on 01/03/2020 at 1:37 PM to discuss the potential use of a new treatment for mild to moderate COVID-19 viral infection in non-hospitalized patients.  This patient is a 49 y.o. male that meets the FDA criteria for Emergency Use Authorization of COVID monoclonal antibody casirivimab/imdevimab or bamlanivimab/eteseviamb.  Has a (+) direct SARS-CoV-2 viral test result  Has mild or moderate COVID-19   Is NOT hospitalized due to COVID-19  Is within 10 days of symptom onset  Has at least one of the high risk factor(s) for progression to severe COVID-19 and/or hospitalization as defined in EUA.  Specific high risk criteria : BMI > 25   I have spoken and communicated the following to the patient or parent/caregiver regarding COVID monoclonal antibody treatment:  1. FDA has authorized the emergency use for the treatment of mild to moderate COVID-19 in adults and pediatric patients with positive results of direct SARS-CoV-2 viral testing who are 85 years of age and older weighing at least 40 kg, and who are at high risk for progressing to severe COVID-19 and/or hospitalization.  2. The significant known and potential risks and benefits of COVID monoclonal antibody, and the extent to which such potential risks and benefits are unknown.  3. Information on available alternative treatments and the risks and benefits of those alternatives, including clinical trials.  4. Patients treated with COVID monoclonal antibody should continue to self-isolate and use infection control measures (e.g., wear mask, isolate, social distance, avoid sharing personal items, clean and disinfect "high touch" surfaces, and frequent handwashing) according to CDC guidelines.   5. The patient or parent/caregiver has the option to accept or refuse COVID monoclonal antibody treatment.  After reviewing this information with the patient, the patient has agreed to receive one of the  available covid 19 monoclonal antibodies and will be provided an appropriate fact sheet prior to infusion. Kathrine Haddock, NP 01/03/2020 1:37 PM  Sx onset 9/21

## 2020-01-05 ENCOUNTER — Encounter: Payer: Self-pay | Admitting: Family Medicine

## 2020-01-05 ENCOUNTER — Telehealth (INDEPENDENT_AMBULATORY_CARE_PROVIDER_SITE_OTHER): Payer: 59 | Admitting: Family Medicine

## 2020-01-05 ENCOUNTER — Ambulatory Visit (HOSPITAL_COMMUNITY)
Admission: RE | Admit: 2020-01-05 | Discharge: 2020-01-05 | Disposition: A | Discharge status: Home / Self Care | Payer: 59 | Source: Ambulatory Visit | Attending: Pulmonary Disease | Admitting: Pulmonary Disease

## 2020-01-05 ENCOUNTER — Other Ambulatory Visit (HOSPITAL_COMMUNITY): Payer: Self-pay

## 2020-01-05 VITALS — BP 148/98 | HR 78 | Temp 98.8°F | Resp 18

## 2020-01-05 DIAGNOSIS — R05 Cough: Secondary | ICD-10-CM | POA: Diagnosis not present

## 2020-01-05 DIAGNOSIS — U071 COVID-19: Secondary | ICD-10-CM | POA: Diagnosis not present

## 2020-01-05 DIAGNOSIS — R059 Cough, unspecified: Secondary | ICD-10-CM

## 2020-01-05 MED ORDER — ALBUTEROL SULFATE HFA 108 (90 BASE) MCG/ACT IN AERS: 2.0000 | INHALATION_SPRAY | Freq: Once | RESPIRATORY_TRACT | Status: DC | PRN

## 2020-01-05 MED ORDER — EPINEPHRINE 0.3 MG/0.3ML IJ SOAJ: 0.3000 mg | Freq: Once | INTRAMUSCULAR | Status: DC | PRN

## 2020-01-05 MED ORDER — ALBUTEROL SULFATE HFA 108 (90 BASE) MCG/ACT IN AERS: 2.0000 | INHALATION_SPRAY | Freq: Four times a day (QID) | RESPIRATORY_TRACT | 2 refills | Status: DC | PRN

## 2020-01-05 MED ORDER — DIPHENHYDRAMINE HCL 50 MG/ML IJ SOLN: 50.0000 mg | Freq: Once | INTRAMUSCULAR | Status: DC | PRN

## 2020-01-05 MED ORDER — SODIUM CHLORIDE 0.9 % IV SOLN
1200.0000 mg | Freq: Once | INTRAVENOUS | Status: AC
  Administered 2020-01-05: 1200 mg via INTRAVENOUS

## 2020-01-05 MED ORDER — METHYLPREDNISOLONE SODIUM SUCC 125 MG IJ SOLR: 125.0000 mg | Freq: Once | INTRAMUSCULAR | Status: DC | PRN

## 2020-01-05 MED ORDER — SODIUM CHLORIDE 0.9 % IV SOLN: INTRAVENOUS | Status: DC | PRN

## 2020-01-05 MED ORDER — FAMOTIDINE IN NACL 20-0.9 MG/50ML-% IV SOLN: 20.0000 mg | Freq: Once | INTRAVENOUS | Status: DC | PRN

## 2020-01-05 MED ORDER — BENZONATATE 200 MG PO CAPS: 200.0000 mg | ORAL_CAPSULE | Freq: Two times a day (BID) | ORAL | 0 refills | Status: DC | PRN

## 2020-01-05 MED ORDER — AZITHROMYCIN 250 MG PO TABS: ORAL_TABLET | ORAL | 0 refills | Status: DC

## 2020-01-05 NOTE — Progress Notes (Signed)
MyChart Video Visit    Virtual Visit via Video Note   This visit type was conducted due to national recommendations for restrictions regarding the COVID-19 Pandemic (e.g. social distancing) in an effort to limit this patient's exposure and mitigate transmission in our community. This patient is at least at moderate risk for complications without adequate follow up. This format is felt to be most appropriate for this patient at this time. Physical exam was limited by quality of the video and audio technology used for the visit.   Patient location: Home Provider location: Office  I discussed the limitations of evaluation and management by telemedicine and the availability of in person appointments. The patient expressed understanding and agreed to proceed.  Patient: Paul Shepherd   DOB: 10/15/1970   49 y.o. Male  MRN: [id number] Visit Date: 01/05/2020  Today's healthcare provider: Vernie Murders, PA   Chief Complaint  Patient presents with   Covid Positive   Subjective    HPI   Patient is a 49 year old male who presents via video visit for Covid positive with symptoms.  He began having a headache 1 week ago.  Then 2 days later started with sore throat and cough.  He did a home test on 9/24 and it was positive.  His wife has since tested and she was negative.  His symptoms are listed in review of systems.  He is taking Mucinex AM/PM. He is scheduled for MAB infusion today at 11:00 am.  Past Medical History:  Diagnosis Date   Cancer ([place])    BASAL CELL CARCINOMA   Sleep apnea    no cpap-pt having UPPP on 10-22-14   Past Surgical History:  Procedure Laterality Date   BICEPS TENDON REPAIR     FRACTURE SURGERY     WRIST   TONSILLECTOMY Bilateral 10/22/2014   Procedure: TONSILLECTOMY;  Surgeon: Carloyn Manner, MD;  Location: [facility] ORS;  Service: ENT;  Laterality: Bilateral;   UVULOPALATOPHARYNGOPLASTY N/A 10/22/2014   Procedure: UVULOPALATOPHARYNGOPLASTY (UPPP);   Surgeon: Carloyn Manner, MD;  Location: [facility] ORS;  Service: ENT;  Laterality: N/A;   Social History   Tobacco Use   Smoking status: Former Smoker    Packs/day: 1.00    Years: 0.00    Pack years: 0.00    Types: Cigarettes    Quit date: 10/14/2004    Years since quitting: 15.2   Smokeless tobacco: Never Used  Substance Use Topics   Alcohol use: No   Drug use: No   Family Status  Relation Name Status   Father  Deceased   Allergies  Allergen Reactions   Lovastatin     Muscle pain       Medications: Outpatient Medications Prior to Visit  Medication Sig   KRILL OIL PO Take 1 capsule by mouth.   psyllium (METAMUCIL) 58.6 % powder Take 1 packet by mouth daily.   Red Yeast Rice Extract 600 MG CAPS Take by mouth.   No facility-administered medications prior to visit.    Review of Systems  Constitutional: Positive for fever (Thurs and Friday only). Negative for chills, diaphoresis and fatigue.  HENT: Positive for congestion, postnasal drip, rhinorrhea, sore throat (Wed-Fri) and voice change (Fri only). Negative for ear pain, facial swelling, nosebleeds, sinus pressure, sinus pain, sneezing, tinnitus and trouble swallowing.   Eyes: Negative for pain and itching.  Respiratory: Positive for cough, shortness of breath and wheezing. Negative for choking and chest tightness.   Cardiovascular: Negative for chest pain  and palpitations.  Gastrointestinal: Negative for abdominal pain, constipation, diarrhea, nausea and vomiting.  Musculoskeletal: Negative for arthralgias, back pain and myalgias.      Objective    There were no vitals taken for this visit.   Physical Exam: WDWN male in no apparent respiratory distress.  Head: Normocephalic, atraumatic. Neck: Supple, NROM Respiratory: No apparent distress Psych: Normal mood and affect     Assessment & Plan     1. Cough Developed a headache 1 week ago that progressed to a ticklish cough with PND in 2 days. No longer  having any headache. Denies sore throat, fatigue, GI upset or fever. Feels this is similar to past sinus infections but a home COVID test was positive Friday 01-02-20. Having some wheezing with the non-productive cough. Will treat with albuterol inhaler, continue Mucinex, add azithromycin and Tessalon Perles prn. Encouraged to proceed with MAB infusion. Maintain quarantine isolation for 10 days and monitor for fever. Increase fluid intake and recheck in 3-5 days if worsening. - albuterol (VENTOLIN HFA) 108 (90 Base) MCG/ACT inhaler; Inhale 2 puffs into the lungs every 6 (six) hours as needed for wheezing or shortness of breath.  Dispense: 8 g; Refill: 2 - benzonatate (TESSALON) 200 MG capsule; Take 1 capsule (200 mg total) by mouth 2 (two) times daily as needed for cough.  Dispense: 20 capsule; Refill: 0 - azithromycin (ZITHROMAX) 250 MG tablet; Take 2 tablets by mouth today then 1 daily for 4 days.  Dispense: 6 tablet; Refill: 0  2. COVID-19 virus infection Positive home test on Friday 01-02-20. At risk due to being overweight and states he has already qualified for MAB infusion scheduled for 11:00 am today. Advised to proceed. - MyChart COVID-19 home monitoring program   No follow-ups on file.     I discussed the assessment and treatment plan with the patient. The patient was provided an opportunity to ask questions and all were answered. The patient agreed with the plan and demonstrated an understanding of the instructions.   The patient was advised to call back or seek an in-person evaluation if the symptoms worsen or if the condition fails to improve as anticipated.  I provided 20 minutes of non-face-to-face time during this encounter.  Andres Shad, PA, have reviewed all documentation for this visit. The documentation on 01/05/20 for the exam, diagnosis, procedures, and orders are all accurate and complete.   Vernie Murders, [place] [phone]  (phone) [phone] (fax)  [place]

## 2020-01-05 NOTE — Discharge Instructions (Signed)

## 2020-01-05 NOTE — Progress Notes (Signed)
  Diagnosis: COVID-19  Physician: Joya Gaskins    Procedure: [facility] Med: casirivimab\imdevimab infusion - Provided patient with casirivimab\imdevimab fact sheet for patients, parents and caregivers prior to infusion.  Complications: No immediate complications noted.  Discharge: Discharged home   Chyrel Masson 01/05/2020

## 2020-01-10 ENCOUNTER — Telehealth: Payer: Self-pay

## 2020-01-10 ENCOUNTER — Other Ambulatory Visit: Payer: Self-pay | Admitting: Family Medicine

## 2020-01-10 ENCOUNTER — Encounter (INDEPENDENT_AMBULATORY_CARE_PROVIDER_SITE_OTHER): Payer: Self-pay

## 2020-01-10 MED ORDER — HYDROCODONE-HOMATROPINE 5-1.5 MG/5ML PO SYRP: 5.0000 mL | ORAL_SOLUTION | Freq: Three times a day (TID) | ORAL | 0 refills | Status: DC | PRN

## 2020-01-10 NOTE — Telephone Encounter (Signed)
Cough all night- med prescribed not helping and an inhaler. States that he cannot get phlegm out and is worse when he lays down. He did not sleep all night. Denies SOB. . If cough remains the same or better: continue to treat with over the counter medications.  Hard candy or cough drops and drinking warm fluids. Adults can also use honey 2 tsp (10 ML) at bedtime.  Marland Kitchen Honey is not recommended for infants under one. . If cough is becoming worse even with the use of over the counter medications and patient is not able to sleep at night, cough becomes productive with sputum that maybe yellow or green in color, contact PCP.  Pt verbalized understanding.

## 2020-01-10 NOTE — Progress Notes (Signed)
Received call from [facility] that patient's cough is keeping him up date. Albuterol helps for a little while and very little relief from Tessalon. Denies fevers, chills, sweats or shortness of breath, but does have some wheezing. Have sent prescription for Hycodan cough syrup and is to advised patient to go to ER if any worsening shortness of breath, fevers, or chills.

## 2020-01-11 ENCOUNTER — Encounter (INDEPENDENT_AMBULATORY_CARE_PROVIDER_SITE_OTHER): Payer: Self-pay

## 2020-01-14 ENCOUNTER — Encounter (INDEPENDENT_AMBULATORY_CARE_PROVIDER_SITE_OTHER): Payer: Self-pay

## 2020-10-01 ENCOUNTER — Encounter: Payer: Self-pay | Admitting: Family Medicine

## 2020-10-01 ENCOUNTER — Ambulatory Visit (INDEPENDENT_AMBULATORY_CARE_PROVIDER_SITE_OTHER): Payer: 59 | Admitting: Family Medicine

## 2020-10-01 ENCOUNTER — Other Ambulatory Visit: Payer: Self-pay

## 2020-10-01 VITALS — BP 145/86 | HR 61 | Temp 99.0°F | Resp 16 | Ht 73.0 in | Wt 291.0 lb

## 2020-10-01 DIAGNOSIS — E782 Mixed hyperlipidemia: Secondary | ICD-10-CM

## 2020-10-01 DIAGNOSIS — Z2821 Immunization not carried out because of patient refusal: Secondary | ICD-10-CM

## 2020-10-01 DIAGNOSIS — Z125 Encounter for screening for malignant neoplasm of prostate: Secondary | ICD-10-CM

## 2020-10-01 DIAGNOSIS — M7661 Achilles tendinitis, right leg: Secondary | ICD-10-CM

## 2020-10-01 DIAGNOSIS — Z1159 Encounter for screening for other viral diseases: Secondary | ICD-10-CM

## 2020-10-01 DIAGNOSIS — Z0000 Encounter for general adult medical examination without abnormal findings: Secondary | ICD-10-CM | POA: Diagnosis not present

## 2020-10-01 NOTE — Progress Notes (Signed)
Complete physical exam   Patient: Paul Shepherd   DOB: 1971-01-19   50 y.o. Male  MRN: [id number] Visit Date: 10/01/2020  Today's healthcare provider: Vernie Murders, PA-C   Chief Complaint  Patient presents with   Annual Exam   Subjective    Paul Shepherd is a 50 y.o. male who presents today for a complete physical exam.  He reports consuming a general diet. Home exercise routine includes walking w hrs per day. He generally feels well. He reports sleeping well. He does not have additional problems to discuss today.    Past Medical History:  Diagnosis Date   Cancer ([place])    BASAL CELL CARCINOMA   Sleep apnea    no cpap-pt having UPPP on 10-22-14   Past Surgical History:  Procedure Laterality Date   BICEPS TENDON REPAIR     FRACTURE SURGERY     WRIST   TONSILLECTOMY Bilateral 10/22/2014   Procedure: TONSILLECTOMY;  Surgeon: Carloyn Manner, MD;  Location: [facility] ORS;  Service: ENT;  Laterality: Bilateral;   UVULOPALATOPHARYNGOPLASTY N/A 10/22/2014   Procedure: UVULOPALATOPHARYNGOPLASTY (UPPP);  Surgeon: Carloyn Manner, MD;  Location: [facility] OR[facility];  Service: ENT;  Laterality: N/A;   Social History   Socioeconomic History   Marital status: Married    Spouse name: Not on file   Number of children: Not on file   Years of education: Not on file   Highest education level: Not on file  Occupational History   Not on file  Tobacco Use   Smoking status: Former    Packs/day: 1.00    Years: 0.00    Pack years: 0.00    Types: Cigarettes    Quit date: 10/14/2004    Years since quitting: 15.9   Smokeless tobacco: Never  Substance and Sexual Activity   Alcohol use: No   Drug use: No   Sexual activity: Not on file  Other Topics Concern   Not on file  Social History Narrative   Not on file   Social Determinants of Health   Financial Resource Strain: Not on file  Food Insecurity: Not on file  Transportation Needs: Not on file  Physical Activity: Not on  file  Stress: Not on file  Social Connections: Not on file  Intimate Partner Violence: Not on file   Family Status  Relation Name Status   Father  Deceased   Family History  Problem Relation Age of Onset   Cirrhosis Father    Allergies  Allergen Reactions   Lovastatin     Muscle pain     Patient Care Team: Jannice Beitzel, Driscilla Grammes as PCP - General (Physician Assistant)   Medications: Outpatient Medications Prior to Visit  Medication Sig   KRILL OIL PO Take 1 capsule by mouth.   psyllium (METAMUCIL) 58.6 % powder Take 1 packet by mouth daily.   Red Yeast Rice Extract 600 MG CAPS Take by mouth.   albuterol (VENTOLIN HFA) 108 (90 Base) MCG/ACT inhaler Inhale 2 puffs into the lungs every 6 (six) hours as needed for wheezing or shortness of breath.   azithromycin (ZITHROMAX) 250 MG tablet Take 2 tablets by mouth today then 1 daily for 4 days.   benzonatate (TESSALON) 200 MG capsule Take 1 capsule (200 mg total) by mouth 2 (two) times daily as needed for cough.   HYDROcodone-homatropine (HYCODAN) 5-1.5 MG/5ML syrup Take 5 mLs by mouth every 8 (eight) hours as needed for cough.   No facility-administered medications prior  to visit.    Review of Systems  Constitutional: Negative.   HENT: Negative.    Eyes: Negative.   Respiratory: Negative.    Cardiovascular: Negative.   Gastrointestinal: Negative.   Endocrine: Negative.   Genitourinary: Negative.   Musculoskeletal:  Positive for arthralgias.  Skin: Negative.   Allergic/Immunologic: Negative.   Neurological: Negative.   Hematological: Negative.   Psychiatric/Behavioral: Negative.       Objective    BP (!) 145/86   Pulse 61   Temp 99 F (37.2 C)   Resp 16   Ht 6\' 1"  (1.854 m)   Wt 291 lb (132 kg)   BMI 38.39 kg/m  BP Readings from Last 3 Encounters:  10/01/20 (!) 145/86  01/05/20 (!) 148/98  07/25/19 135/82   Wt Readings from Last 3 Encounters:  10/01/20 291 lb (132 kg)  07/25/19 284 lb (128.8 kg)   03/15/18 286 lb (129.7 kg)   Physical Exam Constitutional:      Appearance: He is well-developed.  HENT:     Head: Normocephalic and atraumatic.     Right Ear: External ear normal.     Left Ear: External ear normal.     Nose: Nose normal.  Eyes:     General:        Right eye: No discharge.     Conjunctiva/sclera: Conjunctivae normal.     Pupils: Pupils are equal, round, and reactive to light.  Neck:     Thyroid: No thyromegaly.     Trachea: No tracheal deviation.  Cardiovascular:     Rate and Rhythm: Normal rate and regular rhythm.     Heart sounds: Normal heart sounds. No murmur heard. Pulmonary:     Effort: Pulmonary effort is normal. No respiratory distress.     Breath sounds: Normal breath sounds. No wheezing or rales.  Chest:     Chest wall: No tenderness.  Abdominal:     General: There is no distension.     Palpations: Abdomen is soft. There is no mass.     Tenderness: There is no abdominal tenderness. There is no guarding or rebound.  Genitourinary:    Penis: Normal.      Testes: Normal.     Prostate: Normal.     Rectum: Normal. Guaiac result negative.  Musculoskeletal:        General: No tenderness. Normal range of motion.     Cervical back: Normal range of motion and neck supple.     Comments: Slight tenderness at the posterior right heel at Achille's attachment. Slight loss in full extension of the right elbow from past bicep rupture surgery.   Lymphadenopathy:     Cervical: No cervical adenopathy.  Skin:    General: Skin is warm and dry.     Findings: No erythema or rash.  Neurological:     Mental Status: He is alert and oriented to person, place, and time.     Cranial Nerves: No cranial nerve deficit.     Motor: No abnormal muscle tone.     Coordination: Coordination normal.     Deep Tendon Reflexes: Reflexes are normal and symmetric. Reflexes normal.  Psychiatric:        Behavior: Behavior normal.        Thought Content: Thought content normal.         Judgment: Judgment normal.      Last depression screening scores PHQ 2/9 Scores 10/01/2020 07/25/2019 09/21/2017  PHQ - 2 Score 0 0 0  PHQ-  9 Score 0 0 0   Last fall risk screening Fall Risk  10/01/2020  Falls in the past year? 0  Number falls in past yr: 0  Injury with Fall? 0  Risk for fall due to : No Fall Risks  Follow up Falls evaluation completed   Last Audit-C alcohol use screening Alcohol Use Disorder Test (AUDIT) 10/01/2020  1. How often do you have a drink containing alcohol? 3  2. How many drinks containing alcohol do you have on a typical day when you are drinking? 0  3. How often do you have six or more drinks on one occasion? 1  AUDIT-C Score 4  4. How often during the last year have you found that you were not able to stop drinking once you had started? 0  5. How often during the last year have you failed to do what was normally expected from you because of drinking? 0  6. How often during the last year have you needed a first drink in the morning to get yourself going after a heavy drinking session? 0  7. How often during the last year have you had a feeling of guilt of remorse after drinking? 0  8. How often during the last year have you been unable to remember what happened the night before because you had been drinking? 0  9. Have you or someone else been injured as a result of your drinking? 0  10. Has a relative or friend or a doctor or another health worker been concerned about your drinking or suggested you cut down? 0  Alcohol Use Disorder Identification Test Final Score (AUDIT) 4   A score of 3 or more in women, and 4 or more in men indicates increased risk for alcohol abuse, EXCEPT if all of the points are from question 1   No results found for any visits on 10/01/20.  Assessment & Plan    Routine Health Maintenance and Physical Exam  Exercise Activities and Dietary recommendations  Goals   Continue low fat diet and regular exercise program. Drink  plenty of water daily.     Immunization History  Administered Date(s) Administered   Influenza,inj,Quad PF,6+ Mos 02/22/2018   Influenza-Unspecified 12/10/2015, 01/14/2017   Td 10/27/2004, 05/12/2016    Health Maintenance  Topic Date Due   COVID-19 Vaccine (1) Never done   Hepatitis C Screening  Never done   COLONOSCOPY (Pts 45-101yrs Insurance coverage will need to be confirmed)  Never done   INFLUENZA VACCINE  11/08/2020   TETANUS/TDAP  05/12/2026   HIV Screening  Completed   Pneumococcal Vaccine 26-77 Years old  Aged Out   HPV VACCINES  Aged Out    Discussed health benefits of physical activity, and encouraged him to engage in regular exercise appropriate for his age and condition.  1. Annual physical exam Good general health. Recheck labs and given health maintenance counseling. - CBC with Differential/Platelet - Comprehensive metabolic panel - Hemoglobin A1c - TSH  2. Hyperlipidemia, mixed Still taking Red Yeast Rice, Metamucil and Omega-3 Fish Oil. Recheck labs. - Lipid panel  3. Screening for prostate cancer Asymptomatic. - PSA  4. Need for hepatitis C screening test - Hepatitis C antibody  5. Right Achilles tendinitis No known injury to the right heel. Some discomfort until he stretches his ankle with dorsiflexion. No redness or tenderness today. May need podiatry referral if worsening.  6. COVID-19 vaccination declined    No follow-ups on file.  I, Darragh Nay, PA-C, have reviewed all documentation for this visit. The documentation on 10/01/20 for the exam, diagnosis, procedures, and orders are all accurate and complete.    Vernie Murders, PA-C  [employer] [phone] (phone) [phone] (fax)  [place]

## 2020-10-02 LAB — COMPREHENSIVE METABOLIC PANEL WITH GFR
ALT: 32 [IU]/L (ref 0–44)
AST: 23 [IU]/L (ref 0–40)
Albumin/Globulin Ratio: 1.9 (ref 1.2–2.2)
Albumin: 4.4 g/dL (ref 4.0–5.0)
Alkaline Phosphatase: 84 [IU]/L (ref 44–121)
BUN/Creatinine Ratio: 12 (ref 9–20)
BUN: 13 mg/dL (ref 6–24)
Bilirubin Total: 0.5 mg/dL (ref 0.0–1.2)
CO2: 22 mmol/L (ref 20–29)
Calcium: 9.6 mg/dL (ref 8.7–10.2)
Chloride: 102 mmol/L (ref 96–106)
Creatinine, Ser: 1.11 mg/dL (ref 0.76–1.27)
Globulin, Total: 2.3 g/dL (ref 1.5–4.5)
Glucose: 95 mg/dL (ref 65–99)
Potassium: 4.6 mmol/L (ref 3.5–5.2)
Sodium: 138 mmol/L (ref 134–144)
Total Protein: 6.7 g/dL (ref 6.0–8.5)
eGFR: 82 mL/min/{1.73_m2}

## 2020-10-02 LAB — HEPATITIS C ANTIBODY: Hep C Virus Ab: <0.1 {s_co_ratio} (ref 0.0–0.9)

## 2020-10-02 LAB — CBC WITH DIFFERENTIAL/PLATELET
Basophils Absolute: 0 10*3/uL (ref 0.0–0.2)
Basos: 1 %
EOS (ABSOLUTE): 0 10*3/uL (ref 0.0–0.4)
Eos: 1 %
Hematocrit: 42.2 % (ref 37.5–51.0)
Hemoglobin: 14.3 g/dL (ref 13.0–17.7)
Immature Grans (Abs): 0 10*3/uL (ref 0.0–0.1)
Immature Granulocytes: 0 %
Lymphocytes Absolute: 2.2 10*3/uL (ref 0.7–3.1)
Lymphs: 40 %
MCH: 29.7 pg (ref 26.6–33.0)
MCHC: 33.9 g/dL (ref 31.5–35.7)
MCV: 88 fL (ref 79–97)
Monocytes Absolute: 0.5 10*3/uL (ref 0.1–0.9)
Monocytes: 9 %
Neutrophils Absolute: 2.8 10*3/uL (ref 1.4–7.0)
Neutrophils: 49 %
Platelets: 218 10*3/uL (ref 150–450)
RBC: 4.82 x10E6/uL (ref 4.14–5.80)
RDW: 12.8 % (ref 11.6–15.4)
WBC: 5.5 10*3/uL (ref 3.4–10.8)

## 2020-10-02 LAB — PSA: Prostate Specific Ag, Serum: 0.5 ng/mL (ref 0.0–4.0)

## 2020-10-02 LAB — HEMOGLOBIN A1C
Est. average glucose Bld gHb Est-mCnc: 117 mg/dL
Hgb A1c MFr Bld: 5.7 % — ABNORMAL HIGH (ref 4.8–5.6)

## 2020-10-02 LAB — LIPID PANEL
Chol/HDL Ratio: 5 ratio (ref 0.0–5.0)
Cholesterol, Total: 196 mg/dL (ref 100–199)
HDL: 39 mg/dL — ABNORMAL LOW
LDL Chol Calc (NIH): 130 mg/dL — ABNORMAL HIGH (ref 0–99)
Triglycerides: 150 mg/dL — ABNORMAL HIGH (ref 0–149)
VLDL Cholesterol Cal: 27 mg/dL (ref 5–40)

## 2020-10-02 LAB — TSH: TSH: 1.81 u[IU]/mL (ref 0.450–4.500)

## 2021-02-10 ENCOUNTER — Ambulatory Visit: Payer: 59 | Admitting: Family Medicine

## 2021-10-13 NOTE — Progress Notes (Signed)
I,Roshena L Chambers,acting as a scribe for Lelon Huh, MD.,have documented all relevant documentation on the behalf of Lelon Huh, MD,as directed by  Lelon Huh, MD while in the presence of Lelon Huh, MD.    Complete physical exam   Patient: Paul Shepherd   DOB: 04-Sep-1970   51 y.o. Male  MRN: [id number] Visit Date: 10/14/2021  Today's healthcare provider: Lelon Huh, MD   Chief Complaint  Patient presents with   Annual Exam   Hyperlipidemia   Subjective    Paul Shepherd is a 51 y.o. male who presents today for a complete physical exam.  He reports consuming a general diet. The patient does not participate in regular exercise at present. He generally feels fairly well. He reports sleeping fairly well. He does have additional problems to discuss today.  HPI  Lipid/Cholesterol, Follow-up  Last lipid panel Other pertinent labs  Lab Results  Component Value Date   CHOL 196 10/01/2020   HDL 39 (L) 10/01/2020   LDLCALC 130 (H) 10/01/2020   TRIG 150 (H) 10/01/2020   CHOLHDL 5.0 10/01/2020   Lab Results  Component Value Date   ALT 32 10/01/2020   AST 23 10/01/2020   PLT 218 10/01/2020   TSH 1.810 10/01/2020     He was last seen for this 1  year  ago.  Management since that visit includes advising patient to follow Low fat diet, exercise for 30-40 minutes 3-4 days a week. He reports good compliance with treatment. He is not having side effects.   Symptoms: No chest pain No chest pressure/discomfort  No dyspnea No lower extremity edema  No numbness or tingling of extremity No orthopnea  No palpitations No paroxysmal nocturnal dyspnea  No speech difficulty No syncope   Current diet: well balanced Current exercise: none  The 10-year ASCVD risk score (Arnett DK, et al., 2019) is: 4.4%  ---------------------------------------------------------------------------------------------------  Hyperglycemia, Follow-up  Lab Results  Component  Value Date   HGBA1C 5.7 (H) 10/01/2020   HGBA1C 5.6 07/25/2019   GLUCOSE 95 10/01/2020   GLUCOSE 90 07/25/2019   GLUCOSE 93 03/15/2018    Last seen for for this1  year  ago.  Management since that visit includes advising patient to work on low fat diet, exercise for 30-40 minutes 3-4 days a week, drinking extra water in diet (6-8 glasses a day) and losing weight . Current symptoms include none and have been stable.  Prior visit with dietician: no Current diet: well balanced Current exercise: none  Pertinent Labs:    Component Value Date/Time   CHOL 196 10/01/2020 1007   TRIG 150 (H) 10/01/2020 1007   CHOLHDL 5.0 10/01/2020 1007   CREATININE 1.11 10/01/2020 1007    Wt Readings from Last 3 Encounters:  10/14/21 286 lb (129.7 kg)  10/01/20 291 lb (132 kg)  07/25/19 284 lb (128.8 kg)    -----------------------------------------------------------------------------------------    Past Medical History:  Diagnosis Date   Cancer ([place])    BASAL CELL CARCINOMA   Sleep apnea    no cpap-pt having UPPP on 10-22-14   Past Surgical History:  Procedure Laterality Date   BICEPS TENDON REPAIR     FRACTURE SURGERY     WRIST   TONSILLECTOMY Bilateral 10/22/2014   Procedure: TONSILLECTOMY;  Surgeon: Carloyn Manner, MD;  Location: [facility] ORS;  Service: ENT;  Laterality: Bilateral;   UVULOPALATOPHARYNGOPLASTY N/A 10/22/2014   Procedure: UVULOPALATOPHARYNGOPLASTY (UPPP);  Surgeon: Carloyn Manner, MD;  Location: [facility] ORS;  Service:  ENT;  Laterality: N/A;   Social History   Socioeconomic History   Marital status: Married    Spouse name: Not on file   Number of children: Not on file   Years of education: Not on file   Highest education level: Not on file  Occupational History   Not on file  Tobacco Use   Smoking status: Former    Packs/day: 1.00    Years: 0.00    Total pack years: 0.00    Types: Cigarettes    Quit date: 10/14/2004    Years since quitting: 17.0   Smokeless  tobacco: Never  Substance and Sexual Activity   Alcohol use: No   Drug use: No   Sexual activity: Not on file  Other Topics Concern   Not on file  Social History Narrative   Not on file   Social Determinants of Health   Financial Resource Strain: Not on file  Food Insecurity: Not on file  Transportation Needs: Not on file  Physical Activity: Not on file  Stress: Not on file  Social Connections: Not on file  Intimate Partner Violence: Not on file   Family Status  Relation Name Status   Mother  Alive   Father  Deceased   Sister  Alive   PGF  Deceased   Pat Uncle  Deceased   Family History  Problem Relation Age of Onset   Cirrhosis Father    Heart attack Paternal Grandfather    Heart attack Paternal Uncle    Allergies  Allergen Reactions   Lovastatin     Muscle pain     Patient Care Team: Chrismon, Vickki Muff, PA-C (Inactive) as PCP - General (Physician Assistant)   Medications: Outpatient Medications Prior to Visit  Medication Sig   KRILL OIL PO Take 1 capsule by mouth.   psyllium (METAMUCIL) 58.6 % powder Take 1 packet by mouth daily.   Red Yeast Rice Extract 600 MG CAPS Take by mouth.   albuterol (VENTOLIN HFA) 108 (90 Base) MCG/ACT inhaler Inhale 2 puffs into the lungs every 6 (six) hours as needed for wheezing or shortness of breath. (Patient not taking: Reported on 10/14/2021)   No facility-administered medications prior to visit.    Review of Systems  Constitutional:  Negative for appetite change, chills, fatigue and fever.  HENT:  Negative for congestion, ear pain, hearing loss, nosebleeds and trouble swallowing.   Eyes:  Negative for pain and visual disturbance.  Respiratory:  Negative for cough, chest tightness and shortness of breath.   Cardiovascular:  Negative for chest pain, palpitations and leg swelling.  Gastrointestinal:  Negative for abdominal pain, blood in stool, constipation, diarrhea, nausea and vomiting.  Endocrine: Negative for polydipsia,  polyphagia and polyuria.  Genitourinary:  Negative for dysuria and flank pain.  Musculoskeletal:  Positive for arthralgias. Negative for back pain, joint swelling, myalgias and neck stiffness.       Umbilical enlargement  Skin:  Negative for color change, rash and wound.       Skin lesion on the back of right calf  Neurological:  Positive for numbness (right shoulder). Negative for dizziness, tremors, seizures, speech difficulty, weakness, light-headedness and headaches.  Psychiatric/Behavioral:  Negative for behavioral problems, confusion, decreased concentration, dysphoric mood and sleep disturbance. The patient is not nervous/anxious.   All other systems reviewed and are negative.     Objective     BP (!) 143/94 (BP Location: Right Arm, Patient Position: Sitting, Cuff Size: Large)   Pulse 71  Temp 98.6 F (37 C) (Oral)   Resp 16   Ht '6\' 1"'$  (1.854 m)   Wt 286 lb (129.7 kg)   SpO2 99% Comment: room air  BMI 37.73 kg/m   Today's Vitals   10/14/21 0900 10/14/21 0908  BP: (!) 157/92 (!) 143/94  Pulse: 71   Resp: 16   Temp: 98.6 F (37 C)   TempSrc: Oral   SpO2: 99%   Weight: 286 lb (129.7 kg)   Height: '6\' 1"'$  (1.854 m)    Body mass index is 37.73 kg/m.   Physical Exam   General Appearance:    Mildly obese male. Alert, cooperative, in no acute distress, appears stated age  Head:    Normocephalic, without obvious abnormality, atraumatic  Eyes:    PERRL, conjunctiva/corneas clear, EOM's intact, fundi    benign, both eyes       Ears:    Normal TM's and external ear canals, both ears  Nose:   Nares normal, septum midline, mucosa normal, no drainage   or sinus tenderness  Throat:   Lips, mucosa, and tongue normal; teeth and gums normal  Neck:   Supple, symmetrical, trachea midline, no adenopathy;       thyroid:  No enlargement/tenderness/nodules; no carotid   bruit or JVD  Back:     Symmetric, no curvature, ROM normal, no CVA tenderness  Lungs:     Clear to auscultation  bilaterally, respirations unlabored  Chest wall:    No tenderness or deformity  Heart:    Normal heart rate. Normal rhythm. No murmurs, rubs, or gallops.  S1 and S2 normal  Abdomen:     Soft, non-tender, bowel sounds active all four quadrants,    no masses, no organomegaly  Genitalia:    deferred  Rectal:    deferred  Extremities:   All extremities are intact. No cyanosis or edema  Pulses:   2+ and symmetric all extremities  Skin:   Skin color, texture, turgor normal, no rashes or lesions  Lymph nodes:   Cervical, supraclavicular, and axillary nodes normal  Neurologic:   CNII-XII intact. Normal strength, sensation and reflexes      throughout     Last depression screening scores    10/14/2021    9:03 AM 10/01/2020    9:13 AM 07/25/2019   11:00 AM  PHQ 2/9 Scores  PHQ - 2 Score 0 0 0  PHQ- 9 Score 0 0 0   Last fall risk screening    10/14/2021    9:04 AM  Fall Risk   Falls in the past year? 0  Number falls in past yr: 0  Injury with Fall? 0  Risk for fall due to : No Fall Risks  Follow up Falls evaluation completed   Last Audit-C alcohol use screening    10/01/2020    9:13 AM  Alcohol Use Disorder Test (AUDIT)  1. How often do you have a drink containing alcohol? 3  2. How many drinks containing alcohol do you have on a typical day when you are drinking? 0  3. How often do you have six or more drinks on one occasion? 1  AUDIT-C Score 4  4. How often during the last year have you found that you were not able to stop drinking once you had started? 0  5. How often during the last year have you failed to do what was normally expected from you because of drinking? 0  6. How often during the last  year have you needed a first drink in the morning to get yourself going after a heavy drinking session? 0  7. How often during the last year have you had a feeling of guilt of remorse after drinking? 0  8. How often during the last year have you been unable to remember what happened the  night before because you had been drinking? 0  9. Have you or someone else been injured as a result of your drinking? 0  10. Has a relative or friend or a doctor or another health worker been concerned about your drinking or suggested you cut down? 0  Alcohol Use Disorder Identification Test Final Score (AUDIT) 4   A score of 3 or more in women, and 4 or more in men indicates increased risk for alcohol abuse, EXCEPT if all of the points are from question 1   No results found for any visits on 10/14/21.  Assessment & Plan    Routine Health Maintenance and Physical Exam  Exercise Activities and Dietary recommendations  Goals   None     Immunization History  Administered Date(s) Administered   Influenza Inj Mdck Quad Pf 01/19/2019   Influenza,inj,Quad PF,6+ Mos 02/22/2018, 01/25/2020   Influenza-Unspecified 12/10/2015, 01/14/2017   Td 10/27/2004, 05/12/2016    Health Maintenance  Topic Date Due   COVID-19 Vaccine (1) Never done   COLONOSCOPY (Pts 45-42yr Insurance coverage will need to be confirmed)  Never done   INFLUENZA VACCINE  11/08/2021   TETANUS/TDAP  05/12/2026   Hepatitis C Screening  Completed   HIV Screening  Completed   HPV VACCINES  Aged Out    Discussed health benefits of physical activity, and encouraged him to engage in regular exercise appropriate for his age and condition.   - CBC - Comprehensive metabolic panel - Lipid panel  2. Hyperlipidemia, mixed Has improved diet. ASCVD risk is under 5, but he does have history of early CAD in paternal grandfather and paternal uncle.   - CBC - Comprehensive metabolic panel - Lipid panel  3. Umbilical hernia without obstruction and without gangrene Asymptomatic.   4. Hyperglycemia  - Hemoglobin A1c  5. Elevated blood pressure reading He does have home brachial bp monitor and advised to start checking BP 1-2 times a week and let me know if elevated at homee  6. Colon cancer screening  - Fecal occult  blood, imunochemical     The entirety of the information documented in the History of Present Illness, Review of Systems and Physical Exam were personally obtained by me. Portions of this information were initially documented by the CMA and reviewed by me for thoroughness and accuracy.     DLelon Huh MD  [phone](phone) [phone](fax)  [place]

## 2021-10-14 ENCOUNTER — Ambulatory Visit (INDEPENDENT_AMBULATORY_CARE_PROVIDER_SITE_OTHER): Payer: 59 | Admitting: Family Medicine

## 2021-10-14 ENCOUNTER — Encounter: Payer: Self-pay | Admitting: Family Medicine

## 2021-10-14 VITALS — BP 143/94 | HR 71 | Temp 98.6°F | Resp 16 | Ht 73.0 in | Wt 286.0 lb

## 2021-10-14 DIAGNOSIS — K429 Umbilical hernia without obstruction or gangrene: Secondary | ICD-10-CM | POA: Diagnosis not present

## 2021-10-14 DIAGNOSIS — Z1211 Encounter for screening for malignant neoplasm of colon: Secondary | ICD-10-CM | POA: Diagnosis not present

## 2021-10-14 DIAGNOSIS — Z0000 Encounter for general adult medical examination without abnormal findings: Secondary | ICD-10-CM

## 2021-10-14 DIAGNOSIS — R739 Hyperglycemia, unspecified: Secondary | ICD-10-CM | POA: Diagnosis not present

## 2021-10-14 DIAGNOSIS — R030 Elevated blood-pressure reading, without diagnosis of hypertension: Secondary | ICD-10-CM

## 2021-10-14 DIAGNOSIS — E782 Mixed hyperlipidemia: Secondary | ICD-10-CM

## 2021-10-14 NOTE — Patient Instructions (Addendum)
Please review the attached list of medications and notify my office if there are any errors.   Please bring all of your medications to every appointment so we can make sure that our medication list is the same as yours.   Check your blood pressure at home once or twice a week and let me know if you see the top number consistently over 140, of the bottom number consistently over 90

## 2021-10-15 LAB — CBC
Hematocrit: 43.9 % (ref 37.5–51.0)
Hemoglobin: 14.7 g/dL (ref 13.0–17.7)
MCH: 29.1 pg (ref 26.6–33.0)
MCHC: 33.5 g/dL (ref 31.5–35.7)
MCV: 87 fL (ref 79–97)
Platelets: 244 10*3/uL (ref 150–450)
RBC: 5.05 x10E6/uL (ref 4.14–5.80)
RDW: 12.6 % (ref 11.6–15.4)
WBC: 5.3 10*3/uL (ref 3.4–10.8)

## 2021-10-15 LAB — LIPID PANEL
Chol/HDL Ratio: 5.3 ratio — ABNORMAL HIGH (ref 0.0–5.0)
Cholesterol, Total: 200 mg/dL — ABNORMAL HIGH (ref 100–199)
HDL: 38 mg/dL — ABNORMAL LOW (ref >39)
LDL Chol Calc (NIH): 126 mg/dL — ABNORMAL HIGH (ref 0–99)
Triglycerides: 202 mg/dL — ABNORMAL HIGH (ref 0–149)
VLDL Cholesterol Cal: 36 mg/dL (ref 5–40)

## 2021-10-15 LAB — COMPREHENSIVE METABOLIC PANEL
ALT: 36 IU/L (ref 0–44)
AST: 27 IU/L (ref 0–40)
Albumin/Globulin Ratio: 1.9 (ref 1.2–2.2)
Albumin: 4.5 g/dL (ref 4.0–5.0)
Alkaline Phosphatase: 91 IU/L (ref 44–121)
BUN/Creatinine Ratio: 13 (ref 9–20)
BUN: 14 mg/dL (ref 6–24)
Bilirubin Total: 0.6 mg/dL (ref 0.0–1.2)
CO2: 21 mmol/L (ref 20–29)
Calcium: 9.4 mg/dL (ref 8.7–10.2)
Chloride: 100 mmol/L (ref 96–106)
Creatinine, Ser: 1.04 mg/dL (ref 0.76–1.27)
Globulin, Total: 2.4 g/dL (ref 1.5–4.5)
Glucose: 98 mg/dL (ref 70–99)
Potassium: 4.6 mmol/L (ref 3.5–5.2)
Sodium: 135 mmol/L (ref 134–144)
Total Protein: 6.9 g/dL (ref 6.0–8.5)
eGFR: 89 mL/min/{1.73_m2} (ref >59)

## 2021-10-15 LAB — HEMOGLOBIN A1C
Est. average glucose Bld gHb Est-mCnc: 123 mg/dL
Hgb A1c MFr Bld: 5.9 % — ABNORMAL HIGH (ref 4.8–5.6)

## 2021-11-11 ENCOUNTER — Other Ambulatory Visit: Payer: Self-pay

## 2021-11-11 DIAGNOSIS — Z1211 Encounter for screening for malignant neoplasm of colon: Secondary | ICD-10-CM

## 2021-11-11 LAB — IFOBT (OCCULT BLOOD): IFOBT: NEGATIVE

## 2021-11-11 NOTE — Addendum Note (Signed)
Addended by: Doristine Devoid on: 11/11/2021 03:32 PM   Modules accepted: Orders

## 2022-11-10 ENCOUNTER — Ambulatory Visit (INDEPENDENT_AMBULATORY_CARE_PROVIDER_SITE_OTHER): Payer: 59 | Admitting: Family Medicine

## 2022-11-10 ENCOUNTER — Encounter: Payer: Self-pay | Admitting: Family Medicine

## 2022-11-10 VITALS — BP 134/86 | HR 60 | Temp 97.6°F | Resp 16 | Ht 73.0 in | Wt 285.6 lb

## 2022-11-10 DIAGNOSIS — Z8249 Family history of ischemic heart disease and other diseases of the circulatory system: Secondary | ICD-10-CM

## 2022-11-10 DIAGNOSIS — Z0000 Encounter for general adult medical examination without abnormal findings: Secondary | ICD-10-CM

## 2022-11-10 DIAGNOSIS — E782 Mixed hyperlipidemia: Secondary | ICD-10-CM | POA: Diagnosis not present

## 2022-11-10 DIAGNOSIS — K429 Umbilical hernia without obstruction or gangrene: Secondary | ICD-10-CM

## 2022-11-10 NOTE — Progress Notes (Signed)
Complete physical exam   Patient: Paul Shepherd   DOB: 02/08/1971   52 y.o. Male  MRN: [id number] Visit Date: 11/10/2022  Today's healthcare provider: Mila Merry, MD   Chief Complaint  Patient presents with   Annual Exam   Subjective    Paul Shepherd is a 52 y.o. male who presents today for a complete physical exam.  He reports consuming a  regular  diet.  He generally feels well. He reports sleeping well. He does not have additional problems to discuss today.    Past Medical History:  Diagnosis Date   Cancer ([facility])    BASAL CELL CARCINOMA   Childhood asthma    Sleep apnea    no cpap-pt having UPPP on 10-22-14   Past Surgical History:  Procedure Laterality Date   BICEPS TENDON REPAIR     FRACTURE SURGERY     WRIST   TONSILLECTOMY Bilateral 10/22/2014   Procedure: TONSILLECTOMY;  Surgeon: Bud Face, MD;  Location: [facility] ORS;  Service: ENT;  Laterality: Bilateral;   UVULOPALATOPHARYNGOPLASTY N/A 10/22/2014   Procedure: UVULOPALATOPHARYNGOPLASTY (UPPP);  Surgeon: Bud Face, MD;  Location: [facility];  Service: ENT;  Laterality: N/A;   Social History   Socioeconomic History   Marital status: Married    Spouse name: Not on file   Number of children: Not on file   Years of education: Not on file   Highest education level: Not on file  Occupational History   Not on file  Tobacco Use   Smoking status: Former    Current packs/day: 0.00    Types: Cigarettes    Start date: 10/14/2004    Quit date: 10/14/2004    Years since quitting: 18.0   Smokeless tobacco: Never  Substance and Sexual Activity   Alcohol use: No   Drug use: No   Sexual activity: Not on file  Other Topics Concern   Not on file  Social History Narrative   Not on file   Social Determinants of Health   Financial Resource Strain: Not on file  Food Insecurity: Not on file  Transportation Needs: Not on file  Physical Activity: Not on file  Stress: Not on file  Social  Connections: Not on file  Intimate Partner Violence: Not on file   Family Status  Relation Name Status   Mother  Alive   Father  Deceased   Sister  Alive   PGF  Deceased   Pat Uncle  Deceased  No partnership data on file   Family History  Problem Relation Age of Onset   Cirrhosis Father    Heart attack Paternal Grandfather        MI in his 10s   Heart attack Paternal Uncle    Allergies  Allergen Reactions   Lovastatin     Muscle pain     Patient Care Team: Malva Limes, MD as PCP - General (Family Medicine) Sherrie Mustache Demetrios Isaacs, MD as Referring Physician (Family Medicine) Dasher, Cliffton Asters, MD (Dermatology)   Medications: Outpatient Medications Prior to Visit  Medication Sig   KRILL OIL PO Take 1 capsule by mouth.   psyllium (METAMUCIL) 58.6 % powder Take 1 packet by mouth daily.   Red Yeast Rice Extract 600 MG CAPS Take by mouth.   [DISCONTINUED] albuterol (VENTOLIN HFA) 108 (90 Base) MCG/ACT inhaler Inhale 2 puffs into the lungs every 6 (six) hours as needed for wheezing or shortness of breath. (Patient not taking: Reported on 10/14/2021)  No facility-administered medications prior to visit.     Objective    BP 134/86 (BP Location: Left Arm, Cuff Size: Large)   Pulse 60   Temp 97.6 F (36.4 C) (Temporal)   Resp 16   Ht 6\' 1"  (1.854 m)   Wt 285 lb 9.6 oz (129.5 kg)   SpO2 97%   BMI 37.68 kg/m   Physical Exam   General Appearance:    Mildly obese male. Alert, cooperative, in no acute distress, appears stated age  Head:    Normocephalic, without obvious abnormality, atraumatic  Eyes:    PERRL, conjunctiva/corneas clear, EOM's intact, fundi    benign, both eyes       Ears:    Normal TM's and external ear canals, both ears  Nose:   Nares normal, septum midline, mucosa normal, no drainage   or sinus tenderness  Throat:   Lips, mucosa, and tongue normal; teeth and gums normal  Neck:   Supple, symmetrical, trachea midline, no adenopathy;       thyroid:  No  enlargement/tenderness/nodules; no carotid   bruit or JVD  Back:     Symmetric, no curvature, ROM normal, no CVA tenderness  Lungs:     Clear to auscultation bilaterally, respirations unlabored  Chest wall:    No tenderness or deformity  Heart:    Normal heart rate. Normal rhythm. No murmurs, rubs, or gallops.  S1 and S2 normal  Abdomen:     Soft, non-tender, bowel sounds active all four quadrants,    no masses, no organomegaly. Small Umbilical hernia  Genitalia:    deferred  Rectal:    deferred  Extremities:   All extremities are intact. No cyanosis or edema  Pulses:   2+ and symmetric all extremities  Skin:   Skin color, texture, turgor normal, no rashes or lesions  Lymph nodes:   Cervical, supraclavicular, and axillary nodes normal  Neurologic:   CNII-XII intact. Normal strength, sensation and reflexes      throughout     Last depression screening scores    11/10/2022    9:00 AM 10/14/2021    9:03 AM 10/01/2020    9:13 AM  PHQ 2/9 Scores  PHQ - 2 Score 0 0 0  PHQ- 9 Score  0 0   Last fall risk screening    11/10/2022    9:00 AM  Fall Risk   Falls in the past year? 0  Number falls in past yr: 0  Injury with Fall? 0  Risk for fall due to : No Fall Risks  Follow up Falls evaluation completed   Last Audit-C alcohol use screening    10/01/2020    9:13 AM  Alcohol Use Disorder Test (AUDIT)  1. How often do you have a drink containing alcohol? 3  2. How many drinks containing alcohol do you have on a typical day when you are drinking? 0  3. How often do you have six or more drinks on one occasion? 1  AUDIT-C Score 4  4. How often during the last year have you found that you were not able to stop drinking once you had started? 0  5. How often during the last year have you failed to do what was normally expected from you because of drinking? 0  6. How often during the last year have you needed a first drink in the morning to get yourself going after a heavy drinking session? 0   7. How often during the last  year have you had a feeling of guilt of remorse after drinking? 0  8. How often during the last year have you been unable to remember what happened the night before because you had been drinking? 0  9. Have you or someone else been injured as a result of your drinking? 0  10. Has a relative or friend or a doctor or another health worker been concerned about your drinking or suggested you cut down? 0  Alcohol Use Disorder Identification Test Final Score (AUDIT) 4   A score of 3 or more in women, and 4 or more in men indicates increased risk for alcohol abuse, EXCEPT if all of the points are from question 1     Assessment & Plan    Routine Health Maintenance and Physical Exam  Exercise Activities and Dietary recommendations  Goals   None     Immunization History  Administered Date(s) Administered   Influenza Inj Mdck Quad Pf 01/19/2019   Influenza,inj,Quad PF,6+ Mos 02/22/2018, 01/25/2020   Influenza-Unspecified 12/10/2015, 01/14/2017   Td 10/27/2004, 05/12/2016    Health Maintenance  Topic Date Due   Colonoscopy  Never done   COVID-19 Vaccine (1 - 2023-24 season) Never done   INFLUENZA VACCINE  11/09/2022   COLON CANCER SCREENING ANNUAL FOBT  11/12/2022   DTaP/Tdap/Td (3 - Tdap) 05/12/2026   Hepatitis C Screening  Completed   HIV Screening  Completed   HPV VACCINES  Aged Out    Discussed health benefits of physical activity, and encouraged him to engage in regular exercise appropriate for his age and condition.  He was given [employer] home collection kit for CRC screening.   He was advised to schedule routine eye exam.   2. Umbilical hernia without obstruction and without gangrene Is not bothering him. Reassured of benign nature. Advised of option for surgical repair.   3. Hyperlipidemia, mixed Intolerant to lovastatin, is taking OTC Red Yeast Rice.   - CBC - Comprehensive metabolic panel - Lipid panel   4. Family history of heart  disease  - Lipoprotein A (LPA) - Apolipoprotein B        Mila Merry, MD  [facility] [phone] (phone) [phone] (fax)  [facility]

## 2022-11-10 NOTE — Patient Instructions (Addendum)
Please review the attached list of medications and notify my office if there are any errors.   Please contact your eyecare professional to schedule a routine eye exam. I recommend seeing Dr. Keith Nice at [phone] or the [facility] at [phone]  

## 2022-12-29 ENCOUNTER — Other Ambulatory Visit (INDEPENDENT_AMBULATORY_CARE_PROVIDER_SITE_OTHER): Payer: 59

## 2022-12-29 DIAGNOSIS — Z1211 Encounter for screening for malignant neoplasm of colon: Secondary | ICD-10-CM

## 2022-12-29 LAB — IFOBT (OCCULT BLOOD): IFOBT: NEGATIVE

## 2023-12-11 ENCOUNTER — Telehealth: Payer: Self-pay

## 2023-12-11 DIAGNOSIS — E782 Mixed hyperlipidemia: Secondary | ICD-10-CM

## 2023-12-11 DIAGNOSIS — Z125 Encounter for screening for malignant neoplasm of prostate: Secondary | ICD-10-CM

## 2023-12-11 NOTE — Telephone Encounter (Signed)
 Orders placed. He can go to lab 1 week before CPE. Needs to be fasting.

## 2023-12-11 NOTE — Telephone Encounter (Signed)
 Copied from CRM [id number]. Topic: Clinical - Request for Lab/Test Order >> Dec 07, 2023  1:13 PM Rosaria E wrote: Reason for CRM: Pt wants to have his labs drawn prior to his physical. Please advise when orders are ready.

## 2023-12-11 NOTE — Addendum Note (Signed)
 Addended by: GASPER NANCYANN BRAVO on: 12/11/2023 02:10 PM   Modules accepted: Orders

## 2024-01-19 LAB — CBC
Hematocrit: 48.6 % (ref 37.5–51.0)
Hemoglobin: 15.9 g/dL (ref 13.0–17.7)
MCH: 30.1 pg (ref 26.6–33.0)
MCHC: 32.7 g/dL (ref 31.5–35.7)
MCV: 92 fL (ref 79–97)
Platelets: 242 x10E3/uL (ref 150–450)
RBC: 5.28 x10E6/uL (ref 4.14–5.80)
RDW: 12.6 % (ref 11.6–15.4)
WBC: 6 x10E3/uL (ref 3.4–10.8)

## 2024-01-19 LAB — LIPID PANEL
Chol/HDL Ratio: 4.6 ratio (ref 0.0–5.0)
Cholesterol, Total: 190 mg/dL (ref 100–199)
HDL: 41 mg/dL (ref >39)
LDL Chol Calc (NIH): 121 mg/dL — ABNORMAL HIGH (ref 0–99)
Triglycerides: 155 mg/dL — ABNORMAL HIGH (ref 0–149)
VLDL Cholesterol Cal: 28 mg/dL (ref 5–40)

## 2024-01-19 LAB — COMPREHENSIVE METABOLIC PANEL WITH GFR
ALT: 30 IU/L (ref 0–44)
AST: 26 IU/L (ref 0–40)
Albumin: 4.2 g/dL (ref 3.8–4.9)
Alkaline Phosphatase: 87 IU/L (ref 47–123)
BUN/Creatinine Ratio: 16 (ref 9–20)
BUN: 15 mg/dL (ref 6–24)
Bilirubin Total: 0.7 mg/dL (ref 0.0–1.2)
CO2: 22 mmol/L (ref 20–29)
Calcium: 9.5 mg/dL (ref 8.7–10.2)
Chloride: 101 mmol/L (ref 96–106)
Creatinine, Ser: 0.94 mg/dL (ref 0.76–1.27)
Globulin, Total: 2.3 g/dL (ref 1.5–4.5)
Glucose: 92 mg/dL (ref 70–99)
Potassium: 4.6 mmol/L (ref 3.5–5.2)
Sodium: 136 mmol/L (ref 134–144)
Total Protein: 6.5 g/dL (ref 6.0–8.5)
eGFR: 98 mL/min/1.73 (ref >59)

## 2024-01-19 LAB — PSA TOTAL (REFLEX TO FREE): Prostate Specific Ag, Serum: 0.7 ng/mL (ref 0.0–4.0)

## 2024-01-20 ENCOUNTER — Ambulatory Visit: Payer: Self-pay | Admitting: Family Medicine

## 2024-01-25 ENCOUNTER — Ambulatory Visit (INDEPENDENT_AMBULATORY_CARE_PROVIDER_SITE_OTHER): Admitting: Family Medicine

## 2024-01-25 ENCOUNTER — Encounter: Payer: Self-pay | Admitting: Family Medicine

## 2024-01-25 VITALS — BP 148/88 | HR 78 | Resp 16 | Ht 73.0 in | Wt 281.0 lb

## 2024-01-25 DIAGNOSIS — E782 Mixed hyperlipidemia: Secondary | ICD-10-CM

## 2024-01-25 DIAGNOSIS — R030 Elevated blood-pressure reading, without diagnosis of hypertension: Secondary | ICD-10-CM | POA: Diagnosis not present

## 2024-01-25 DIAGNOSIS — Z8249 Family history of ischemic heart disease and other diseases of the circulatory system: Secondary | ICD-10-CM | POA: Diagnosis not present

## 2024-01-25 DIAGNOSIS — Z0000 Encounter for general adult medical examination without abnormal findings: Secondary | ICD-10-CM

## 2024-01-25 DIAGNOSIS — Z1211 Encounter for screening for malignant neoplasm of colon: Secondary | ICD-10-CM

## 2024-01-25 NOTE — Progress Notes (Signed)
 Complete physical exam   Patient: Paul Shepherd   DOB: [birth year]   53 y.o. Male  MRN: [id number] Visit Date: 01/25/2024  Today's healthcare provider: Nancyann Perry, MD   Chief Complaint  Patient presents with   Annual Exam    Patient reports doing well. He is not exercising but has a strenuous job. He is sleeping well.  Vaccines: Declined all vaccines.   Subjective    Discussed the use of AI scribe software for clinical note transcription with the patient, who gave verbal consent to proceed.  History of Present Illness   Paul Shepherd is a 53 year old male who presents for an annual physical exam.  He notes that his blood pressure is consistently high when initially checked at the doctor's office but tends to decrease by the time he leaves. At home, his blood pressure readings are lower, typically around 110/75 to 120/75 when he is relaxed. No chest pain or breathing difficulties are reported.  He completed his blood work last week, which showed cholesterol slightly above normal but lower than previous results. Rest of labs including PSA were normal.   He experiences tinnitus, which he attributes to work and shooting guns. He now uses hearing protection.  He reports a past stress test due to abnormal heartbeats, which was attributed to dehydration. He has not had an EKG in a while.  He has a family history of heart disease, with his uncle and grandfather having died of massive heart attacks. His father died of cirrhosis of the liver at age 65, and his mother passed away from breast cancer.     Lab Results  Component Value Date   CHOL 190 01/18/2024   HDL 41 01/18/2024   LDLCALC 121 (H) 01/18/2024   TRIG 155 (H) 01/18/2024   CHOLHDL 4.6 01/18/2024   Lab Results  Component Value Date   PSA1 0.7 01/18/2024   PSA1 0.5 10/01/2020   PSA1 0.7 07/25/2019    Lab Results  Component Value Date   NA 136 01/18/2024   K 4.6 01/18/2024   CREATININE 0.94  01/18/2024   EGFR 98 01/18/2024   GLUCOSE 92 01/18/2024   The 10-year ASCVD risk score (Arnett DK, et al., 2019) is: 5.6%     Past Medical History:  Diagnosis Date   Cancer (HCC)    BASAL CELL CARCINOMA   Childhood asthma    Sleep apnea    no cpap-pt having UPPP on 10-22-14   Past Surgical History:  Procedure Laterality Date   BICEPS TENDON REPAIR     FRACTURE SURGERY     WRIST   TONSILLECTOMY Bilateral 10/22/2014   Procedure: TONSILLECTOMY;  Surgeon: Carolee Hunter, MD;  Location: [facility];  Service: ENT;  Laterality: Bilateral;   UVULOPALATOPHARYNGOPLASTY N/A 10/22/2014   Procedure: UVULOPALATOPHARYNGOPLASTY (UPPP);  Surgeon: Carolee Hunter, MD;  Location: [facility];  Service: ENT;  Laterality: N/A;   Social History   Socioeconomic History   Marital status: Married    Spouse name: Not on file   Number of children: Not on file   Years of education: Not on file   Highest education level: Not on file  Occupational History   Not on file  Tobacco Use   Smoking status: Former    Current packs/day: 0.00    Types: Cigarettes    Start date: 10/14/2004    Quit date: 10/14/2004    Years since quitting: 19.2   Smokeless tobacco: Never  Substance and Sexual  Activity   Alcohol use: No   Drug use: No   Sexual activity: Not on file  Other Topics Concern   Not on file  Social History Narrative   Not on file   Social Drivers of Health   Financial Resource Strain: Not on file  Food Insecurity: Not on file  Transportation Needs: Not on file  Physical Activity: Not on file  Stress: Not on file  Social Connections: Not on file  Intimate Partner Violence: Not on file   Family Status  Relation Name Status   Mother  Alive   Father  Deceased   Sister  Alive   PGF  Deceased   Pat Uncle  Deceased  No partnership data on file   Family History  Problem Relation Age of Onset   Cirrhosis Father    Heart attack Paternal Grandfather        MI in his 68s   Heart attack  Paternal Uncle    Allergies  Allergen Reactions   Lovastatin      Muscle pain     Patient Care Team: Gasper Nancyann BRAVO, MD as PCP - General (Family Medicine) Gasper Nancyann BRAVO, MD as Referring Physician (Family Medicine) Dasher, Alm LABOR, MD (Dermatology)   Medications: Outpatient Medications Prior to Visit  Medication Sig   KRILL OIL PO Take 1 capsule by mouth.   psyllium (METAMUCIL) 58.6 % powder Take 1 packet by mouth daily.   Red Yeast Rice Extract 600 MG CAPS Take by mouth.   No facility-administered medications prior to visit.    Review of Systems  Constitutional:  Negative for appetite change, chills and fever.  Respiratory:  Negative for chest tightness, shortness of breath and wheezing.   Cardiovascular:  Negative for chest pain and palpitations.  Gastrointestinal:  Negative for abdominal pain, nausea and vomiting.      Objective    BP (!) 148/88 (BP Location: Left Arm, Patient Position: Sitting, Cuff Size: Large) Comment: manually  Pulse 78   Resp 16   Ht 6' 1 (1.854 m)   Wt 281 lb (127.5 kg)   SpO2 97%   BMI 37.07 kg/m    Physical Exam  General Appearance:    Overweight male. Alert, cooperative, in no acute distress, appears stated age  Head:    Normocephalic, without obvious abnormality, atraumatic  Eyes:    PERRL, conjunctiva/corneas clear, EOM's intact, fundi    benign, both eyes       Ears:    Normal TM's and external ear canals, both ears  Nose:   Nares normal, septum midline, mucosa normal, no drainage   or sinus tenderness  Throat:   Lips, mucosa, and tongue normal; teeth and gums normal  Neck:   Supple, symmetrical, trachea midline, no adenopathy;       thyroid:  No enlargement/tenderness/nodules; no carotid   bruit or JVD  Back:     Symmetric, no curvature, ROM normal, no CVA tenderness  Lungs:     Clear to auscultation bilaterally, respirations unlabored  Chest wall:    No tenderness or deformity  Heart:    Normal heart rate. Normal rhythm.  No murmurs, rubs, or gallops.  S1 and S2 normal  Abdomen:     Soft, non-tender, bowel sounds active all four quadrants,    no masses, no organomegaly  Genitalia:    deferred  Rectal:    deferred  Extremities:   All extremities are intact. No cyanosis or edema  Pulses:   2+ and  symmetric all extremities  Skin:   Skin color, texture, turgor normal, no rashes or lesions  Lymph nodes:   Cervical, supraclavicular, and axillary nodes normal  Neurologic:   CNII-XII intact. Normal strength, sensation and reflexes      throughout       Last depression screening scores    01/25/2024    9:07 AM 11/10/2022    9:00 AM 10/14/2021    9:03 AM  PHQ 2/9 Scores  PHQ - 2 Score 0 0 0  PHQ- 9 Score   0   Last fall risk screening    01/25/2024    9:07 AM  Fall Risk   Falls in the past year? 0  Number falls in past yr: 0  Injury with Fall? 0  Risk for fall due to : No Fall Risks   Last Audit-C alcohol use screening    10/01/2020    9:13 AM  Alcohol Use Disorder Test (AUDIT)  1. How often do you have a drink containing alcohol? 3  2. How many drinks containing alcohol do you have on a typical day when you are drinking? 0  3. How often do you have six or more drinks on one occasion? 1  AUDIT-C Score 4  4. How often during the last year have you found that you were not able to stop drinking once you had started? 0  5. How often during the last year have you failed to do what was normally expected from you because of drinking? 0  6. How often during the last year have you needed a first drink in the morning to get yourself going after a heavy drinking session? 0  7. How often during the last year have you had a feeling of guilt of remorse after drinking? 0  8. How often during the last year have you been unable to remember what happened the night before because you had been drinking? 0  9. Have you or someone else been injured as a result of your drinking? 0  10. Has a relative or friend or a doctor  or another health worker been concerned about your drinking or suggested you cut down? 0  Alcohol Use Disorder Identification Test Final Score (AUDIT) 4   A score of 3 or more in women, and 4 or more in men indicates increased risk for alcohol abuse, EXCEPT if all of the points are from question 1   No results found for any visits on 01/25/24.  Assessment & Plan    Routine Health Maintenance and Physical Exam  Exercise Activities and Dietary recommendations  Goals   None     Immunization History  Administered Date(s) Administered   Influenza Inj Mdck Quad Pf 01/19/2019   Influenza,inj,Quad PF,6+ Mos 02/22/2018, 01/25/2020   Influenza-Unspecified 12/10/2015, 01/14/2017   Td 10/27/2004, 05/12/2016    Health Maintenance  Topic Date Due   Hepatitis B Vaccines 19-59 Average Risk (1 of 3 - 19+ 3-dose series) Never done   Colonoscopy  Never done   Pneumococcal Vaccine: 50+ Years (1 of 1 - PCV) Never done   Zoster Vaccines- Shingrix (1 of 2) Never done   COVID-19 Vaccine (1 - 2025-26 season) Never done   COLON CANCER SCREENING ANNUAL FOBT  12/29/2023   Influenza Vaccine  07/08/2024 (Originally 11/09/2023)   DTaP/Tdap/Td (3 - Tdap) 05/12/2026   Hepatitis C Screening  Completed   HIV Screening  Completed   HPV VACCINES  Aged Out   Meningococcal B Vaccine  Aged Out    Discussed health benefits of physical activity, and encouraged him to engage in regular exercise appropriate for his age and condition.     Adult Wellness Visit Routine wellness visit with family history of heart disease. Cholesterol levels improved. Declined shingles and pneumonia vaccines. Prefers stool test for colon cancer screening. - Discussed heart scan option due to family history of heart disease. - Continue stool test for colon cancer screening. - Refer for eye exam.  Elevated blood pressure Blood pressure elevated at 142/90, but home readings lower. No medication needed currently. - Advise to monitor  blood pressure at home weekly. - Advise to reduce sodium intake. - Order EKG.  Mixed hyperlipidemia Cholesterol levels improved. Family history of heart disease. Discussed heart scan for coronary artery disease screening. Heart scan measures calcium score, costs $199, not covered by insurance.     Return in about 1 year (around 01/24/2025).        Nancyann Perry, MD  [facility] [phone] (phone) [phone] (fax)  [facility]

## 2024-01-25 NOTE — Patient Instructions (Addendum)
 Please review the attached list of medications and notify my office if there are any errors.   Let me know if you are interest in a coronary CT scan to see if you are developing heart disease

## 2024-04-04 ENCOUNTER — Ambulatory Visit: Payer: Self-pay | Admitting: Family Medicine

## 2024-04-04 ENCOUNTER — Other Ambulatory Visit (INDEPENDENT_AMBULATORY_CARE_PROVIDER_SITE_OTHER)

## 2024-04-04 DIAGNOSIS — Z1211 Encounter for screening for malignant neoplasm of colon: Secondary | ICD-10-CM

## 2024-04-04 LAB — IFOBT (OCCULT BLOOD): IFOBT: NEGATIVE
